# Patient Record
Sex: Male | Born: 1965 | Race: White | Hispanic: No | Marital: Married | Smoking: Never smoker
Health system: Southern US, Community
[De-identification: ages and names within clinical notes are randomized; demographics above are authoritative.]

## PROBLEM LIST (undated history)

## (undated) DIAGNOSIS — S46009A Unspecified injury of muscle(s) and tendon(s) of the rotator cuff of unspecified shoulder, initial encounter: Secondary | ICD-10-CM

## (undated) DIAGNOSIS — J342 Deviated nasal septum: Secondary | ICD-10-CM

## (undated) DIAGNOSIS — N289 Disorder of kidney and ureter, unspecified: Secondary | ICD-10-CM

## (undated) DIAGNOSIS — I10 Essential (primary) hypertension: Secondary | ICD-10-CM

## (undated) HISTORY — PX: NOSE SURGERY: SHX723

## (undated) HISTORY — DX: Deviated nasal septum: J34.2

## (undated) HISTORY — DX: Essential (primary) hypertension: I10

## (undated) HISTORY — DX: Disorder of kidney and ureter, unspecified: N28.9

## (undated) HISTORY — DX: Unspecified injury of muscle(s) and tendon(s) of the rotator cuff of unspecified shoulder, initial encounter: S46.009A

---
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
## 2003-07-02 ENCOUNTER — Emergency Department (HOSPITAL_COMMUNITY): Admission: EM | Admit: 2003-07-02 | Discharge: 2003-07-02 | Payer: Self-pay | Admitting: Emergency Medicine

## 2003-07-06 ENCOUNTER — Inpatient Hospital Stay (HOSPITAL_COMMUNITY): Admission: AD | Admit: 2003-07-06 | Discharge: 2003-07-11 | Payer: Self-pay | Admitting: Family Medicine

## 2003-07-07 ENCOUNTER — Encounter: Payer: Self-pay | Admitting: Infectious Diseases

## 2003-07-17 ENCOUNTER — Encounter: Admission: RE | Admit: 2003-07-17 | Discharge: 2003-07-17 | Payer: Self-pay | Admitting: Family Medicine

## 2005-02-27 ENCOUNTER — Emergency Department (HOSPITAL_COMMUNITY): Admission: EM | Admit: 2005-02-27 | Discharge: 2005-02-28 | Payer: Self-pay | Admitting: Emergency Medicine

## 2014-11-06 ENCOUNTER — Encounter: Payer: Self-pay | Admitting: Internal Medicine

## 2015-01-11 ENCOUNTER — Ambulatory Visit (AMBULATORY_SURGERY_CENTER): Payer: Self-pay

## 2015-01-11 VITALS — Ht 73.5 in | Wt 219.2 lb

## 2015-01-11 DIAGNOSIS — Z1211 Encounter for screening for malignant neoplasm of colon: Secondary | ICD-10-CM

## 2015-01-11 NOTE — Progress Notes (Signed)
Per pt, no allergies to soy or egg products.Pt not taking any weight loss meds or using  O2 at home. 

## 2015-01-18 ENCOUNTER — Encounter: Payer: Self-pay | Admitting: Internal Medicine

## 2015-01-25 ENCOUNTER — Encounter: Payer: Self-pay | Admitting: Internal Medicine

## 2015-01-25 ENCOUNTER — Ambulatory Visit (AMBULATORY_SURGERY_CENTER): Payer: BLUE CROSS/BLUE SHIELD | Admitting: Internal Medicine

## 2015-01-25 VITALS — BP 136/90 | HR 70 | Temp 96.2°F | Resp 13 | Ht 73.5 in | Wt 219.0 lb

## 2015-01-25 DIAGNOSIS — K635 Polyp of colon: Secondary | ICD-10-CM

## 2015-01-25 DIAGNOSIS — Z1211 Encounter for screening for malignant neoplasm of colon: Secondary | ICD-10-CM

## 2015-01-25 DIAGNOSIS — D123 Benign neoplasm of transverse colon: Secondary | ICD-10-CM | POA: Diagnosis not present

## 2015-01-25 MED ORDER — SODIUM CHLORIDE 0.9 % IV SOLN: 500.0000 mL | INTRAVENOUS | Status: DC

## 2015-01-25 NOTE — Progress Notes (Signed)
To recovery, report to [place], Therapist, sports. VSS

## 2015-01-25 NOTE — Progress Notes (Signed)
Called to room to assist during endoscopic procedure.  Patient ID and intended procedure confirmed with present staff. Received instructions for my participation in the procedure from the performing physician.  

## 2015-01-25 NOTE — Patient Instructions (Addendum)
I found and removed one 4 mm polyp that looks benign. Everything else including prostate exam was ok.  I will let you know pathology results and when to have another routine colonoscopy by mail.  I appreciate the opportunity to care for you. Gatha Mayer, MD, FACG  YOU HAD AN ENDOSCOPIC PROCEDURE TODAY AT [facility]:   Refer to the procedure report that was given to you for any specific questions about what was found during the examination.  If the procedure report does not answer your questions, please call your gastroenterologist to clarify.  If you requested that your care partner not be given the details of your procedure findings, then the procedure report has been included in a sealed envelope for you to review at your convenience later.  YOU SHOULD EXPECT: Some feelings of bloating in the abdomen. Passage of more gas than usual.  Walking can help get rid of the air that was put into your GI tract during the procedure and reduce the bloating. If you had a lower endoscopy (such as a colonoscopy or flexible sigmoidoscopy) you may notice spotting of blood in your stool or on the toilet paper. If you underwent a bowel prep for your procedure, you may not have a normal bowel movement for a few days.  Please Note:  You might notice some irritation and congestion in your nose or some drainage.  This is from the oxygen used during your procedure.  There is no need for concern and it should clear up in a day or so.  SYMPTOMS TO REPORT IMMEDIATELY:   Following lower endoscopy (colonoscopy or flexible sigmoidoscopy):  Excessive amounts of blood in the stool  Significant tenderness or worsening of abdominal pains  Swelling of the abdomen that is new, acute  Fever of 100F or higher    For urgent or emergent issues, a gastroenterologist can be reached at any hour by calling [phone].   DIET: Your first meal following the procedure should be a small meal and  then it is ok to progress to your normal diet. Heavy or fried foods are harder to digest and may make you feel nauseous or bloated.  Likewise, meals heavy in dairy and vegetables can increase bloating.  Drink plenty of fluids but you should avoid alcoholic beverages for 24 hours.  ACTIVITY:  You should plan to take it easy for the rest of today and you should NOT DRIVE or use heavy machinery until tomorrow (because of the sedation medicines used during the test).    FOLLOW UP: Our staff will call the number listed on your records the next business day following your procedure to check on you and address any questions or concerns that you may have regarding the information given to you following your procedure. If we do not reach you, we will leave a message.  However, if you are feeling well and you are not experiencing any problems, there is no need to return our call.  We will assume that you have returned to your regular daily activities without incident.  If any biopsies were taken you will be contacted by phone or by letter within the next 1-3 weeks.  Please call us at [phone] if you have not heard about the biopsies in 3 weeks.    SIGNATURES/CONFIDENTIALITY: You and/or your care partner have signed paperwork which will be entered into your electronic medical record.  These signatures attest to the fact that that the information above  on your After Visit Summary has been reviewed and is understood.  Full responsibility of the confidentiality of this discharge information lies with you and/or your care-partner.  Polyp information given.

## 2015-01-25 NOTE — Op Note (Signed)
[address] [place], [address]   COLONOSCOPY PROCEDURE REPORT  PATIENT: Cody Baxter, Cody Baxter  MR#: [id number] BIRTHDATE: 06-09-1966 , 48  yrs. old GENDER: male ENDOSCOPIST: Gatha Mayer, MD, [facility] PROCEDURE DATE:  01/25/2015 PROCEDURE:   Colonoscopy, screening and Colonoscopy with snare polypectomy First Screening Colonoscopy - Avg.  risk and is 50 yrs.  old or older Yes.  Prior Negative Screening - Now for repeat screening. N/A  History of Adenoma - Now for follow-up colonoscopy & has been > or = to 3 yrs.  N/A ASA CLASS:   Class II INDICATIONS:Screening for colonic neoplasia and Colorectal Neoplasm Risk Assessment for this procedure is average risk. MEDICATIONS: Propofol 350 mg IV and Monitored anesthesia care  DESCRIPTION OF PROCEDURE:   After the risks benefits and alternatives of the procedure were thoroughly explained, informed consent was obtained.  The digital rectal exam revealed no abnormalities of the rectum, revealed no prostatic nodules, and revealed the prostate was not enlarged.   The LB [id number] endoscope was introduced through the anus and advanced to the cecum, which was identified by both the appendix and ileocecal valve. No adverse events experienced.   The quality of the prep was excellent.  (MiraLax was used)  The instrument was then slowly withdrawn as the colon was fully examined.      COLON FINDINGS: A sessile polyp measuring 4 mm in size was found in the transverse colon.  A polypectomy was performed with a cold snare.  The resection was complete, the polyp tissue was completely retrieved and sent to histology.   The examination was otherwise normal.  Retroflexed views revealed no abnormalities. The time to cecum = 2.0 Withdrawal time = 11.0   The scope was withdrawn and the procedure completed. COMPLICATIONS: There were no immediate complications.  ENDOSCOPIC IMPRESSION: 1.   Sessile polyp was found in  the transverse colon; polypectomy was performed with a cold snare 2.   The examination was otherwise normal  RECOMMENDATIONS: Timing of repeat colonoscopy will be determined by pathology findings.  eSigned:  Gatha Mayer, MD, [facility] 01/25/2015 8:29 AM   cc: The Patient and Daiva Eves, MD

## 2015-01-25 NOTE — Progress Notes (Signed)
Patient requests that we do not start IV in Right hand or arm.  IV placed in Left hand by Ferrel Logan without difficulty.

## 2015-01-28 ENCOUNTER — Telehealth: Payer: Self-pay

## 2015-01-28 NOTE — Telephone Encounter (Signed)
  Follow up Call-  Call back number 01/25/2015  Post procedure Call Back phone  # [phone]  Permission to leave phone message Yes     Patient questions:  Do you have a fever, pain , or abdominal swelling? No. Pain Score  0 *  Have you tolerated food without any problems? Yes.    Have you been able to return to your normal activities? Yes.    Do you have any questions about your discharge instructions: Diet   No. Medications  No. Follow up visit  No.  Do you have questions or concerns about your Care? No.  Actions: * If pain score is 4 or above: No action needed, pain <4.

## 2015-01-30 ENCOUNTER — Encounter: Payer: Self-pay | Admitting: Internal Medicine

## 2015-01-30 NOTE — Progress Notes (Signed)
Quick Note:  Lymphoid polyp Repeat colonoscopy 2026 ______

## 2016-02-25 DIAGNOSIS — N4231 Prostatic intraepithelial neoplasia: Secondary | ICD-10-CM | POA: Diagnosis not present

## 2016-03-06 DIAGNOSIS — R972 Elevated prostate specific antigen [PSA]: Secondary | ICD-10-CM | POA: Diagnosis not present

## 2016-03-06 DIAGNOSIS — N4231 Prostatic intraepithelial neoplasia: Secondary | ICD-10-CM | POA: Diagnosis not present

## 2016-03-06 DIAGNOSIS — Z0000 Encounter for general adult medical examination without abnormal findings: Secondary | ICD-10-CM | POA: Diagnosis not present

## 2016-06-30 DIAGNOSIS — E782 Mixed hyperlipidemia: Secondary | ICD-10-CM | POA: Diagnosis not present

## 2016-06-30 DIAGNOSIS — R7303 Prediabetes: Secondary | ICD-10-CM | POA: Diagnosis not present

## 2016-07-16 DIAGNOSIS — R7303 Prediabetes: Secondary | ICD-10-CM | POA: Diagnosis not present

## 2016-07-16 DIAGNOSIS — Z1389 Encounter for screening for other disorder: Secondary | ICD-10-CM | POA: Diagnosis not present

## 2016-07-16 DIAGNOSIS — E782 Mixed hyperlipidemia: Secondary | ICD-10-CM | POA: Diagnosis not present

## 2016-07-16 DIAGNOSIS — Z6828 Body mass index (BMI) 28.0-28.9, adult: Secondary | ICD-10-CM | POA: Diagnosis not present

## 2016-07-16 DIAGNOSIS — I10 Essential (primary) hypertension: Secondary | ICD-10-CM | POA: Diagnosis not present

## 2016-09-01 DIAGNOSIS — N4231 Prostatic intraepithelial neoplasia: Secondary | ICD-10-CM | POA: Diagnosis not present

## 2017-02-19 DIAGNOSIS — H5212 Myopia, left eye: Secondary | ICD-10-CM | POA: Diagnosis not present

## 2017-02-22 DIAGNOSIS — N400 Enlarged prostate without lower urinary tract symptoms: Secondary | ICD-10-CM | POA: Diagnosis not present

## 2017-02-22 DIAGNOSIS — R972 Elevated prostate specific antigen [PSA]: Secondary | ICD-10-CM | POA: Diagnosis not present

## 2017-02-22 LAB — PSA: PSA: 2.63

## 2017-04-23 ENCOUNTER — Ambulatory Visit (INDEPENDENT_AMBULATORY_CARE_PROVIDER_SITE_OTHER): Payer: BLUE CROSS/BLUE SHIELD | Admitting: Family Medicine

## 2017-04-23 ENCOUNTER — Encounter: Payer: Self-pay | Admitting: Family Medicine

## 2017-04-23 VITALS — BP 138/85 | HR 79 | Ht 73.0 in | Wt 218.5 lb

## 2017-04-23 DIAGNOSIS — J3089 Other allergic rhinitis: Secondary | ICD-10-CM | POA: Diagnosis not present

## 2017-04-23 DIAGNOSIS — I10 Essential (primary) hypertension: Secondary | ICD-10-CM | POA: Diagnosis not present

## 2017-04-23 DIAGNOSIS — E663 Overweight: Secondary | ICD-10-CM

## 2017-04-23 DIAGNOSIS — E781 Pure hyperglyceridemia: Secondary | ICD-10-CM | POA: Diagnosis not present

## 2017-04-23 NOTE — Progress Notes (Signed)
New patient office visit note:  Impression and Recommendations:    1. HTN, goal below 130/80   2. Hypertriglyceridemia   3. Overweight (BMI 25.0-29.9)   4. Environmental and seasonal allergies    - bp not at goal- encouraged diet and lifestyle mod in addition; home monitoring  - will check labs  - Explained to patient what BMI refers to, and what it means medically.    Told patient to think about it as a "medical risk stratification measurement" and how increasing BMI is associated with increasing risk/ or worsening state of various diseases such as hypertension, hyperlipidemia, diabetes, premature OA, depression etc. -American Heart Association guidelines for healthy diet, basically Mediterranean diet, and exercise guidelines of 30 minutes 5 days per week or more discussed in detail.  - encouraged OTC meds to help with sx; netti pot encouraged  -Health counseling performed.  All questions answered.  - The patient was counseled, risk factors were discussed, anticipatory guidance given.  Please sign up for my chart and we will contact you regarding your labs sooner than later if there are critical findings.  If need be we will bring you in to discuss it in person with you if it requires a treatment change.  Otherwise I will see you in 4-6 months.    Thanks for coming in- nice to meet you!    Orders Placed This Encounter  Procedures  . CBC with Differential/Platelet  . Comprehensive metabolic panel  . Hemoglobin A1c  . Lipid panel  . TSH  . T4, free  . VITAMIN D 25 Hydroxy (Vit-D Deficiency, Fractures)     Gross side effects, risk and benefits, and alternatives of medications discussed with patient.  Patient is aware that all medications have potential side effects and we are unable to predict every side effect or drug-drug interaction that may occur.  Expresses verbal understanding and consents to current therapy plan and treatment regimen.  Return in about 5 months  (around 09/23/2017) for For wellness exam & health maintenance eval- yrly.  Please see AVS handed out to patient at the end of our visit for further patient instructions/ counseling done pertaining to today's office visit.    Note: This document was prepared using Dragon voice recognition software and may include unintentional dictation errors.  ---------------------------------------------------------------------------------------------------------------------    Subjective:    Chief complaint:   Chief Complaint  Patient presents with  . 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   I asked the patient to review their chronic problem list with me to ensure everything was updated and accurate.    All recent office visits with other providers, any medical records that patient brought in etc  - I reviewed today.     Also asked pt to get me medical records from [facility] providers/ specialists that they had seen within the past 3-5 years- if they are in private practice and/or do not work for a [employer], [facility], [facility] or [employer] owned practice.  Told them to call their specialists to clarify this if they are not sure.   History of hypertension, TG, overwt.   Family history: Father with Prostate ca- rad and chemo/ prostatectomy ( age 43), lung ca (SCC),  AMI-s/p stent-age 71  Wt Readings from Last 3 Encounters:  09/03/17 219 lb (99.3 kg)  04/23/17 218 lb 8 oz (99.1 kg)  01/25/15 219 lb (99.3 kg)   BP Readings from Last 3 Encounters:  09/03/17 (!) 148/98  04/23/17 138/85  01/25/15 136/90   Pulse Readings from Last 3 Encounters:  09/03/17 68  04/23/17 79  01/25/15 70   BMI Readings from Last 3 Encounters:  09/03/17 29.70 kg/m  04/23/17 28.83 kg/m  01/25/15 28.50 kg/m    Patient  Care Team    Relationship Specialty Notifications Start End  Mellody Dance, DO PCP - General Family Medicine  04/23/17   Jovita Gamma, [place] Physician Neurosurgery  04/23/17   Rana Snare, MD Consulting Physician Urology  04/23/17   Gatha Mayer, MD Consulting Physician Gastroenterology  04/23/17   Dr. Samara Snide  Optometry  04/26/17   Dr. Michele Mcalpine  Dermatology  04/26/17     Patient Active Problem List   Diagnosis Date Noted  . HTN, goal below 130/80 04/23/2017    Priority: High  . HLD (hyperlipidemia) 09/03/2017    Priority: Medium  . Hypertriglyceridemia 09/03/2017    Priority: Medium  . Low HDL (under 40) 09/03/2017    Priority: Medium  . Environmental and seasonal allergies 10/08/2017    Priority: Low  . Overweight (BMI 25.0-29.9) 09/03/2017  . Nonspecific chest pain 09/03/2017  . Encounter for general adult medical examination with abnormal findings 09/03/2017  . Other chest pain 09/03/2017     Past Medical History:  Diagnosis Date  . Deviated septum   . Hypertension   . Kidney disease      Past Medical History:  Diagnosis Date  . Deviated septum   . Hypertension   . Kidney disease      Past Surgical History:  Procedure Laterality Date  . NOSE SURGERY    . TONSILLECTOMY AND ADENOIDECTOMY  1978   adernoids only     Family History  Problem Relation Age of Onset  . Prostate cancer Father   . Lung cancer Father      Social History   Substance and Sexual Activity  Drug Use No     Social History   Substance and Sexual Activity  Alcohol Use No  . Alcohol/week: 0.0 oz     Social History   Tobacco Use  Smoking Status Never Smoker  Smokeless Tobacco Never Used     Outpatient Encounter Medications as of 04/23/2017  Medication Sig  . fenofibrate micronized (LOFIBRA) 134 MG capsule Take 134 mg by mouth daily before breakfast.  . triamterene-hydrochlorothiazide (MAXZIDE-25) 37.5-25 MG per tablet Take 1 tablet by mouth. Take one pill  2-3 times a week   No facility-administered encounter medications on file as of 04/23/2017.     Allergies: Patient has no known allergies.   Review of Systems  Constitutional: Negative for chills, diaphoresis, fever, malaise/fatigue and weight loss.  HENT: Negative for congestion, sore throat and tinnitus.   Eyes: Negative for blurred vision, double vision and photophobia.  Respiratory: Negative for cough and wheezing.   Cardiovascular: Negative for chest pain and palpitations.  Gastrointestinal: Negative for blood in stool, diarrhea, nausea and vomiting.  Genitourinary: Negative for dysuria, frequency and urgency.  Musculoskeletal: Negative for joint pain and myalgias.  Skin: Negative for itching and rash.  Neurological: Negative for dizziness, focal weakness, weakness and headaches.  Endo/Heme/Allergies: Negative for environmental allergies and polydipsia. Does not bruise/bleed easily.  Psychiatric/Behavioral: Negative for depression and memory loss. The patient is not  nervous/anxious and does not have insomnia.      Objective:   Blood pressure 138/85, pulse 79, height 6\' 1"  (1.854 m), weight 218 lb 8 oz (99.1 kg). Body mass index is 28.83 kg/m. General: Well Developed, well nourished, and in no acute distress.  Neuro: Alert and oriented x3, extra-ocular muscles intact, sensation grossly intact.  HEENT:[place]/AT, PERRLA, neck supple, No carotid bruits Skin: no gross rashes  Cardiac: Regular rate and rhythm Respiratory: Essentially clear to auscultation bilaterally. Not using accessory muscles, speaking in full sentences.  Abdominal: not grossly distended Musculoskeletal: Ambulates w/o diff, FROM * 4 ext.  Vasc: less 2 sec cap RF, warm and pink  Psych:  No HI/SI, judgement and insight good, Euthymic mood. Full Affect.

## 2017-04-23 NOTE — Patient Instructions (Addendum)
Please sign up for my chart and we will contact you regarding your labs sooner than later if there are critical findings.  If need be we will bring you in to discuss it in person with you if it requires a treatment change.  Otherwise I will see you in 4-6 months.    Thanks for coming in- nice to meet you!     Please realize, EXERCISE IS MEDICINE!  -  American Heart Association [facility]) guidelines for exercise : If you are in good health, without any medical conditions, you should engage in 150 minutes of moderate intensity aerobic activity per week.  This means you should be huffing and puffing throughout your workout.   Engaging in regular exercise will improve brain function and memory, as well as improve mood, boost immune system and help with weight management.  As well as the other, more well-known effects of exercise such as decreasing blood sugar levels, decreasing blood pressure,  and decreasing bad cholesterol levels/ increasing good cholesterol levels.     -  The AHA strongly endorses consumption of a diet that contains a variety of foods from all the food categories with an emphasis on fruits and vegetables; fat-free and low-fat dairy products; cereal and grain products; legumes and nuts; and fish, poultry, and/or extra lean meats.    Excessive food intake, especially of foods high in saturated and trans fats, sugar, and salt, should be avoided.    Adequate water intake of roughly 1/2 of your weight in pounds, should equal the ounces of water per day you should drink.  So for instance, if you're 200 pounds, that would be 100 ounces of water per day.         Mediterranean Diet  Why follow it? Research shows. . Those who follow the Mediterranean diet have a reduced risk of heart disease  . The diet is associated with a reduced incidence of Parkinson's and Alzheimer's diseases . People following the diet may have longer life expectancies and lower rates of chronic diseases  . The Dietary  Guidelines for Americans recommends the Mediterranean diet as an eating plan to promote health and prevent disease  What Is the Mediterranean Diet?  . Healthy eating plan based on typical foods and recipes of Mediterranean-style cooking . The diet is primarily a plant based diet; these foods should make up a majority of meals   Starches - Plant based foods should make up a majority of meals - They are an important sources of vitamins, minerals, energy, antioxidants, and fiber - Choose whole grains, foods high in fiber and minimally processed items  - Typical grain sources include wheat, oats, barley, corn, brown rice, bulgar, farro, millet, polenta, couscous  - Various types of beans include chickpeas, lentils, fava beans, black beans, white beans   Fruits  Veggies - Large quantities of antioxidant rich fruits & veggies; 6 or more servings  - Vegetables can be eaten raw or lightly drizzled with oil and cooked  - Vegetables common to the traditional Mediterranean Diet include: artichokes, arugula, beets, broccoli, brussel sprouts, cabbage, carrots, celery, collard greens, cucumbers, eggplant, kale, leeks, lemons, lettuce, mushrooms, okra, onions, peas, peppers, potatoes, pumpkin, radishes, rutabaga, shallots, spinach, sweet potatoes, turnips, zucchini - Fruits common to the Mediterranean Diet include: apples, apricots, avocados, cherries, clementines, dates, figs, grapefruits, grapes, melons, nectarines, oranges, peaches, pears, pomegranates, strawberries, tangerines  Fats - Replace butter and margarine with healthy oils, such as olive oil, canola oil, and tahini  -  Limit nuts to no more than a handful a day  - Nuts include walnuts, almonds, pecans, pistachios, pine nuts  - Limit or avoid candied, honey roasted or heavily salted nuts - Olives are central to the Mediterranean diet - can be eaten whole or used in a variety of dishes   Meats Protein - Limiting red meat: no more than a few times a  month - When eating red meat: choose lean cuts and keep the portion to the size of deck of cards - Eggs: approx. 0 to 4 times a week  - Fish and lean poultry: at least 2 a week  - Healthy protein sources include, chicken, [place], lean beef, lamb - Increase intake of seafood such as tuna, salmon, trout, mackerel, shrimp, scallops - Avoid or limit high fat processed meats such as sausage and bacon  Dairy - Include moderate amounts of low fat dairy products  - Focus on healthy dairy such as fat free yogurt, skim milk, low or reduced fat cheese - Limit dairy products higher in fat such as whole or 2% milk, cheese, ice cream  Alcohol - Moderate amounts of red wine is ok  - No more than 5 oz daily for women (all ages) and men older than age 38  - No more than 10 oz of wine daily for men younger than 52  Other - Limit sweets and other desserts  - Use herbs and spices instead of salt to flavor foods  - Herbs and spices common to the traditional Mediterranean Diet include: basil, bay leaves, chives, cloves, cumin, fennel, garlic, lavender, marjoram, mint, oregano, parsley, pepper, rosemary, sage, savory, sumac, tarragon, thyme   It's not just a diet, it's a lifestyle:  . The Mediterranean diet includes lifestyle factors typical of those in the region  . Foods, drinks and meals are best eaten with others and savored . Daily physical activity is important for overall good health . This could be strenuous exercise like running and aerobics . This could also be more leisurely activities such as walking, housework, yard-work, or taking the stairs . Moderation is the key; a balanced and healthy diet accommodates most foods and drinks . Consider portion sizes and frequency of consumption of certain foods   Meal Ideas & Options:  . Breakfast:  o Whole wheat toast or whole wheat English muffins with peanut butter & hard boiled egg o Steel cut oats topped with apples & cinnamon and skim milk  o Fresh  fruit: banana, strawberries, melon, berries, peaches  o Smoothies: strawberries, bananas, greek yogurt, peanut butter o Low fat greek yogurt with blueberries and granola  o Egg white omelet with spinach and mushrooms o Breakfast couscous: whole wheat couscous, apricots, skim milk, cranberries  . Sandwiches:  o Hummus and grilled vegetables (peppers, zucchini, squash) on whole wheat bread   o Grilled chicken on whole wheat pita with lettuce, tomatoes, cucumbers or tzatziki  o Tuna salad on whole wheat bread: tuna salad made with greek yogurt, olives, red peppers, capers, green onions o Garlic rosemary lamb pita: lamb sauted with garlic, rosemary, salt & pepper; add lettuce, cucumber, greek yogurt to pita - flavor with lemon juice and black pepper  . Seafood:  o Mediterranean grilled salmon, seasoned with garlic, basil, parsley, lemon juice and black pepper o Shrimp, lemon, and spinach whole-grain pasta salad made with low fat greek yogurt  o Seared scallops with lemon orzo  o Seared tuna steaks seasoned salt, pepper, coriander topped with tomato  mixture of olives, tomatoes, olive oil, minced garlic, parsley, green onions and cappers  . Meats:  o Herbed greek chicken salad with kalamata olives, cucumber, feta  o Red bell peppers stuffed with spinach, bulgur, lean ground beef (or lentils) & topped with feta   o Kebabs: skewers of chicken, tomatoes, onions, zucchini, squash  o [place] burgers: made with red onions, mint, dill, lemon juice, feta cheese topped with roasted red peppers . Vegetarian o Cucumber salad: cucumbers, artichoke hearts, celery, red onion, feta cheese, tossed in olive oil & lemon juice  o Hummus and whole grain pita points with a greek salad (lettuce, tomato, feta, olives, cucumbers, red onion) o Lentil soup with celery, carrots made with vegetable broth, garlic, salt and pepper  o Tabouli salad: parsley, bulgur, mint, scallions, cucumbers, tomato, radishes, lemon juice,  olive oil, salt and pepper.

## 2017-04-24 LAB — CBC WITH DIFFERENTIAL/PLATELET
BASOS ABS: 0.1 10*3/uL (ref 0.0–0.2)
Basos: 1 %
EOS (ABSOLUTE): 0.2 10*3/uL (ref 0.0–0.4)
Eos: 3 %
Hematocrit: 45.5 % (ref 37.5–51.0)
Hemoglobin: 15.3 g/dL (ref 13.0–17.7)
Immature Grans (Abs): 0 10*3/uL (ref 0.0–0.1)
Immature Granulocytes: 0 %
Lymphocytes Absolute: 1.8 10*3/uL (ref 0.7–3.1)
Lymphs: 31 %
MCH: 27 pg (ref 26.6–33.0)
MCHC: 33.6 g/dL (ref 31.5–35.7)
MCV: 80 fL (ref 79–97)
MONOCYTES: 5 %
MONOS ABS: 0.3 10*3/uL (ref 0.1–0.9)
Neutrophils Absolute: 3.4 10*3/uL (ref 1.4–7.0)
Neutrophils: 60 %
PLATELETS: 253 10*3/uL (ref 150–379)
RBC: 5.66 x10E6/uL (ref 4.14–5.80)
RDW: 14 % (ref 12.3–15.4)
WBC: 5.6 10*3/uL (ref 3.4–10.8)

## 2017-04-24 LAB — COMPREHENSIVE METABOLIC PANEL
ALK PHOS: 92 IU/L (ref 39–117)
ALT: 21 IU/L (ref 0–44)
AST: 19 IU/L (ref 0–40)
Albumin/Globulin Ratio: 2.2 (ref 1.2–2.2)
Albumin: 5 g/dL (ref 3.5–5.5)
BUN/Creatinine Ratio: 17 (ref 9–20)
BUN: 18 mg/dL (ref 6–24)
Bilirubin Total: 0.4 mg/dL (ref 0.0–1.2)
CO2: 25 mmol/L (ref 20–29)
Calcium: 9.4 mg/dL (ref 8.7–10.2)
Chloride: 104 mmol/L (ref 96–106)
Creatinine, Ser: 1.07 mg/dL (ref 0.76–1.27)
GFR calc Af Amer: 92 mL/min/{1.73_m2} (ref >59)
GFR calc non Af Amer: 79 mL/min/{1.73_m2} (ref >59)
GLUCOSE: 93 mg/dL (ref 65–99)
Globulin, Total: 2.3 g/dL (ref 1.5–4.5)
Potassium: 4.5 mmol/L (ref 3.5–5.2)
Sodium: 145 mmol/L — ABNORMAL HIGH (ref 134–144)
Total Protein: 7.3 g/dL (ref 6.0–8.5)

## 2017-04-24 LAB — HEMOGLOBIN A1C
ESTIMATED AVERAGE GLUCOSE: 97 mg/dL
HEMOGLOBIN A1C: 5 % (ref 4.8–5.6)

## 2017-04-24 LAB — LIPID PANEL
Chol/HDL Ratio: 5.1 ratio — ABNORMAL HIGH (ref 0.0–5.0)
Cholesterol, Total: 158 mg/dL (ref 100–199)
HDL: 31 mg/dL — ABNORMAL LOW (ref >39)
LDL Calculated: 65 mg/dL (ref 0–99)
TRIGLYCERIDES: 310 mg/dL — AB (ref 0–149)
VLDL Cholesterol Cal: 62 mg/dL — ABNORMAL HIGH (ref 5–40)

## 2017-04-24 LAB — VITAMIN D 25 HYDROXY (VIT D DEFICIENCY, FRACTURES): VIT D 25 HYDROXY: 41.9 ng/mL (ref 30.0–100.0)

## 2017-04-24 LAB — TSH: TSH: 5.68 u[IU]/mL — AB (ref 0.450–4.500)

## 2017-04-24 LAB — T4, FREE: Free T4: 1.29 ng/dL (ref 0.82–1.77)

## 2017-04-25 ENCOUNTER — Encounter: Payer: Self-pay | Admitting: Family Medicine

## 2017-04-28 ENCOUNTER — Encounter: Payer: Self-pay | Admitting: Family Medicine

## 2017-04-29 NOTE — Telephone Encounter (Signed)
Called patient and informed him that Dr Raliegh Scarlet is reviewing his labs and they would be available for him to view within the hour.  MPulliam, CMA

## 2017-07-19 ENCOUNTER — Encounter: Payer: Self-pay | Admitting: Family Medicine

## 2017-07-23 ENCOUNTER — Encounter: Payer: BLUE CROSS/BLUE SHIELD | Admitting: Family Medicine

## 2017-08-30 ENCOUNTER — Encounter: Payer: Self-pay | Admitting: Family Medicine

## 2017-09-03 ENCOUNTER — Ambulatory Visit (INDEPENDENT_AMBULATORY_CARE_PROVIDER_SITE_OTHER): Payer: BLUE CROSS/BLUE SHIELD | Admitting: Family Medicine

## 2017-09-03 ENCOUNTER — Encounter: Payer: Self-pay | Admitting: Family Medicine

## 2017-09-03 VITALS — BP 148/98 | HR 68 | Ht 72.0 in | Wt 219.0 lb

## 2017-09-03 DIAGNOSIS — E782 Mixed hyperlipidemia: Secondary | ICD-10-CM | POA: Diagnosis not present

## 2017-09-03 DIAGNOSIS — I10 Essential (primary) hypertension: Secondary | ICD-10-CM | POA: Diagnosis not present

## 2017-09-03 DIAGNOSIS — E781 Pure hyperglyceridemia: Secondary | ICD-10-CM

## 2017-09-03 DIAGNOSIS — Z0001 Encounter for general adult medical examination with abnormal findings: Secondary | ICD-10-CM

## 2017-09-03 DIAGNOSIS — Z23 Encounter for immunization: Secondary | ICD-10-CM

## 2017-09-03 DIAGNOSIS — E785 Hyperlipidemia, unspecified: Secondary | ICD-10-CM | POA: Insufficient documentation

## 2017-09-03 DIAGNOSIS — R079 Chest pain, unspecified: Secondary | ICD-10-CM | POA: Insufficient documentation

## 2017-09-03 DIAGNOSIS — E786 Lipoprotein deficiency: Secondary | ICD-10-CM | POA: Insufficient documentation

## 2017-09-03 DIAGNOSIS — R0789 Other chest pain: Secondary | ICD-10-CM | POA: Insufficient documentation

## 2017-09-03 DIAGNOSIS — E663 Overweight: Secondary | ICD-10-CM

## 2017-09-03 NOTE — Progress Notes (Signed)
Male physical  Impression and Recommendations:    1. Encounter for general adult medical examination with abnormal findings   2. HTN, goal below 130/80   3. Mixed hyperlipidemia   4. Hypertriglyceridemia   5. Low HDL (under 40)   6. Overweight (BMI 25.0-29.9)   7. Nonspecific chest pain   8. Other chest pain     Orders Placed This Encounter  Procedures   MYOCARDIAL PERFUSION IMAGING    Standing Status:   Future    Standing Expiration Date:   11/03/2017    Order Specific Question:   Where should this test be performed    Answer:   [facility] [facility]    Order Specific Question:   Type of stress    Answer:   Exercise    Order Specific Question:   Patient weight in lbs    Answer:   219    Order Specific Question:   Reason for Exam additional comments    Answer:   Patient with low risk chest pain, evaluate for possible ischemia     No problem-specific Assessment & Plan notes found for this encounter.    Patient's Medications  New Prescriptions   No medications on file  Previous Medications   FENOFIBRATE MICRONIZED (LOFIBRA) 134 MG CAPSULE    Take 134 mg by mouth daily before breakfast.   TRIAMTERENE-HYDROCHLOROTHIAZIDE (MAXZIDE-25) 37.5-25 MG PER TABLET    Take 1 tablet by mouth. Take one pill 2-3 times a week  Modified Medications   No medications on file  Discontinued Medications   No medications on file     Please see AVS handed out to patient at the end of our visit for further patient instructions/ counseling done pertaining to today's office visit.  1) Anticipatory Guidance: Discussed importance of wearing a seatbelt while driving, not texting while driving;   sunscreen when outside along with skin surveillance; eating a balanced and modest diet; physical activity at least 25 minutes per day or 150 min/ week moderate to intense activity.  2) Immunizations / Screenings / Labs:   All immunizations are up-to-date per recommendations or will be updated  today. Patient is due for dental and vision screens which pt will schedule independently. Will obtain CBC, CMP, HgA1c, Lipid panel, TSH and vit D when fasting, if not already done recently.   3) Weight:  BMI meaning discussed with patient.  Discussed goal of losing 5-10% of current body weight which would improve overall feelings of well being and improve objective health data. Improve nutrient density of diet through increasing intake of fruits and vegetables and decreasing saturated fats, white flour products and refined sugars.    Gross side effects, risk and benefits, and alternatives of medications discussed with patient.  Patient is aware that all medications have potential side effects and we are unable to predict every side effect or drug-drug interaction that may occur.  Expresses verbal understanding and consents to current therapy plan and treatment regimen.  Follow-up preventative CPE in 1 year. Follow-up office visit pending lab work.  F/up sooner for chronic care management and/or prn    Subjective:    CC: CPE  HPI: Cody Baxter is a 51 y.o. male who presents to [facility] today for a yearly health maintenance exam.     Health Maintenance Summary Reviewed and updated, unless pt declines services.  Aspirin: administering 81 mg daily Colonoscopy:     (Unnecessary secondary to < 50  or > 69 years old.) Tdap: Up to date: needs TD  Pneumovax/PPSV23:  Up to date: see Immunizations. Prevnar 13/PCV13:    To be administered at this encounter. Zostavax:    Postponed. Tobacco History Reviewed:    CT scan for screening lung CA:    Abdominal Ultrasound:     ( Unnecessary secondary to < 55 or > 58 years old) Alcohol:    No concerns, no excessive use Exercise Habits:    STD concerns:   none Drug Use:   None Birth control method:   n/a Testicular/penile concerns:       Patient discussed with me concerns about ejaculation and the fact that he cannot  go a second or third time after initial erection and orgasm.  He has questions whether or not this could be low T and if Cialis or medicines like that could help  -Patient admits to noncompliance with his blood pressure and cholesterol medications.  He also has concerns today about a 1-58-month history of some sharp pains in the left side of his chest.  They come on at rest and with activity at times.  It is sharp and stabbing and does not radiate.  His last one minute or so.  He has no other associated symptoms.  Patient is worried about his heart wondering if he needs a stress test.   Health Maintenance  Topic Date Due   INFLUENZA VACCINE  08/09/2018 (Originally 06/09/2017)   TETANUS/TDAP  04/23/2029 (Originally 05/31/1983)   Hepatitis C Screening  04/23/2029 (Originally 1966/06/16)   HIV Screening  04/23/2029 (Originally 05/31/1979)   COLONOSCOPY  01/24/2025      Wt Readings from Last 3 Encounters:  09/03/17 219 lb (99.3 kg)  04/23/17 218 lb 8 oz (99.1 kg)  01/25/15 219 lb (99.3 kg)   BP Readings from Last 3 Encounters:  09/03/17 (!) 148/98  04/23/17 138/85  01/25/15 136/90   Pulse Readings from Last 3 Encounters:  09/03/17 68  04/23/17 79  01/25/15 70    Patient Active Problem List   Diagnosis Date Noted   HLD (hyperlipidemia) 09/03/2017   Hypertriglyceridemia 09/03/2017   Low HDL (under 40) 09/03/2017   Overweight (BMI 25.0-29.9) 09/03/2017   Nonspecific chest pain 09/03/2017   Encounter for general adult medical examination with abnormal findings 09/03/2017   Other chest pain 09/03/2017   HTN, goal below 130/80 04/23/2017    Past Medical History:  Diagnosis Date   Deviated septum    Hypertension    Kidney disease     Past Surgical History:  Procedure Laterality Date   NOSE SURGERY     TONSILLECTOMY AND ADENOIDECTOMY  1978   adernoids only    Family History  Problem Relation Age of Onset   Prostate cancer Father    Lung cancer Father     History  Drug  Use No  ,  History  Alcohol Use No  ,  History  Smoking Status   Never Smoker  Smokeless Tobacco   Never Used  ,  History  Sexual Activity   Sexual activity: Yes    Patient's Medications  New Prescriptions   No medications on file  Previous Medications   FENOFIBRATE MICRONIZED (LOFIBRA) 134 MG CAPSULE    Take 134 mg by mouth daily before breakfast.   TRIAMTERENE-HYDROCHLOROTHIAZIDE (MAXZIDE-25) 37.5-25 MG PER TABLET    Take 1 tablet by mouth. Take one pill 2-3 times a week  Modified Medications   No medications on file  Discontinued Medications  No medications on file    Patient has no known allergies.  Review of Systems: General:   Denies fever, chills, unexplained weight loss.  Optho/Auditory:   Denies visual changes, blurred vision/LOV Respiratory:   Denies SOB, DOE more than baseline levels.   Cardiovascular:   Denies chest pain, palpitations, new onset peripheral edema  Gastrointestinal:   Denies nausea, vomiting, diarrhea.  Genitourinary: Denies dysuria, freq/ urgency, flank pain or discharge from genitals.  Endocrine:     Denies hot or cold intolerance, polyuria, polydipsia. Musculoskeletal:   Denies unexplained myalgias, joint swelling, unexplained arthralgias, gait problems.  Skin:  Denies rash, suspicious lesions Neurological:     Denies dizziness, unexplained weakness, numbness  Psychiatric/Behavioral:   Denies mood changes, suicidal or homicidal ideations, hallucinations    Objective:     Blood pressure (!) 148/98, pulse 68, height 6' (1.829 m), weight 219 lb (99.3 kg). Body mass index is 29.7 kg/m. General Appearance:    Alert, cooperative, no distress, appears stated age  Head:    Normocephalic, without obvious abnormality, atraumatic  Eyes:    PERRL, conjunctiva/corneas clear, EOM's intact, fundi    benign, both eyes  Ears:    Normal TM's and external ear canals, both ears  Nose:   Nares normal, septum midline, mucosa normal, no drainage    or  sinus tenderness  Throat:   Lips w/o lesion, mucosa moist, and tongue normal; teeth and   gums normal  Neck:   Supple, symmetrical, trachea midline, no adenopathy;    thyroid:  no enlargement/tenderness/nodules; no carotid   bruit or JVD  Back:     Symmetric, no curvature, ROM normal, no CVA tenderness  Lungs:     Clear to auscultation bilaterally, respirations unlabored, no       Wh/ R/ R  Chest Wall:    No tenderness or gross deformity; normal excursion   Heart:    Regular rate and rhythm, S1 and S2 normal, no murmur, rub   or gallop  Abdomen:     Soft, non-tender, bowel sounds active all four quadrants, NO   G/R/R, no masses, no organomegaly  Genitalia:    Ext genitalia: without lesion, no penile rash or discharge, no hernias appreciated   Rectal:    Normal tone, prostate WNL's and equal b/l, no tenderness; guaiac negative stool  Extremities:   Extremities normal, atraumatic, no cyanosis or gross edema  Pulses:   2+ and symmetric all extremities  Skin:   Warm, dry, Skin color, texture, turgor normal, no obvious rashes or lesions  M-Sk:   Ambulates * 4 w/o difficulty, no gross deformities, tone WNL  Neurologic:   CNII-XII intact, normal strength, sensation and reflexes    Throughout Psych:  No HI/SI, judgement and insight good, Euthymic mood. Full Affect.

## 2017-09-03 NOTE — Patient Instructions (Addendum)
Please follow-up in 1-2 months where we will recheck your blood pressure-please bring in a log of home blood pressures.   -Next office visit we will readdress concerns about erectile function and consider changing from a diuretic blood pressure medicine to something such as losartan. -Follow-up office visit we have also discussed what your exercise stress test shows. -Please remember to take a men's once a day multivitamin with at least 11-1998 IUs vitamin D  Preventive Care for Adults, Male A healthy lifestyle and preventive care can promote health and wellness. Preventive health guidelines for men include the following key practices:  A routine yearly physical is a good way to check with your health care provider about your health and preventative screening. It is a chance to share any concerns and updates on your health and to receive a thorough exam.  Visit your dentist for a routine exam and preventative care every 6 months. Brush your teeth twice a day and floss once a day. Good oral hygiene prevents tooth decay and gum disease.  The frequency of eye exams is based on your age, health, family medical history, use of contact lenses, and other factors. Follow your health care provider's recommendations for frequency of eye exams.  Eat a healthy diet. Foods such as vegetables, fruits, whole grains, low-fat dairy products, and lean protein foods contain the nutrients you need without too many calories. Decrease your intake of foods high in solid fats, added sugars, and salt. Eat the right amount of calories for you.Get information about a proper diet from your health care provider, if necessary.  Regular physical exercise is one of the most important things you can do for your health. Most adults should get at least 150 minutes of moderate-intensity exercise (any activity that increases your heart rate and causes you to sweat) each week. In addition, most adults need muscle-strengthening exercises  on 2 or more days a week.  Maintain a healthy weight. The body mass index (BMI) is a screening tool to identify possible weight problems. It provides an estimate of body fat based on height and weight. Your health care provider can find your BMI and can help you achieve or maintain a healthy weight.For adults 20 years and older:  A BMI below 18.5 is considered underweight.  A BMI of 18.5 to 24.9 is normal.  A BMI of 25 to 29.9 is considered overweight.  A BMI of 30 and above is considered obese.  Maintain normal blood lipids and cholesterol levels by exercising and minimizing your intake of saturated fat. Eat a balanced diet with plenty of fruit and vegetables. Blood tests for lipids and cholesterol should begin at age 40 and be repeated every 5 years. If your lipid or cholesterol levels are high, you are over 50, or you are at high risk for heart disease, you may need your cholesterol levels checked more frequently.Ongoing high lipid and cholesterol levels should be treated with medicines if diet and exercise are not working.  If you smoke, find out from your health care provider how to quit. If you do not use tobacco, do not start.  Lung cancer screening is recommended for adults aged 21-80 years who are at high risk for developing lung cancer because of a history of smoking. A yearly low-dose CT scan of the lungs is recommended for people who have at least a 30-pack-year history of smoking and are a current smoker or have quit within the past 15 years. A pack year of smoking is  smoking an average of 1 pack of cigarettes a day for 1 year (for example: 1 pack a day for 30 years or 2 packs a day for 15 years). Yearly screening should continue until the smoker has stopped smoking for at least 15 years. Yearly screening should be stopped for people who develop a health problem that would prevent them from having lung cancer treatment.  If you choose to drink alcohol, do not have more than 2 drinks  per day. One drink is considered to be 12 ounces (355 mL) of beer, 5 ounces (148 mL) of wine, or 1.5 ounces (44 mL) of liquor.  Avoid use of street drugs. Do not share needles with anyone. Ask for help if you need support or instructions about stopping the use of drugs.  High blood pressure causes heart disease and increases the risk of stroke. Your blood pressure should be checked at least every 1-2 years. Ongoing high blood pressure should be treated with medicines, if weight loss and exercise are not effective.  If you are 65-20 years old, ask your health care provider if you should take aspirin to prevent heart disease.  Diabetes screening is done by taking a blood sample to check your blood glucose level after you have not eaten for a certain period of time (fasting). If you are not overweight and you do not have risk factors for diabetes, you should be screened once every 3 years starting at age 37. If you are overweight or obese and you are 90-25 years of age, you should be screened for diabetes every year as part of your cardiovascular risk assessment.  Colorectal cancer can be detected and often prevented. Most routine colorectal cancer screening begins at the age of 38 and continues through age 85. However, your health care provider may recommend screening at an earlier age if you have risk factors for colon cancer. On a yearly basis, your health care provider may provide home test kits to check for hidden blood in the stool. Use of a small camera at the end of a tube to directly examine the colon (sigmoidoscopy or colonoscopy) can detect the earliest forms of colorectal cancer. Talk to your health care provider about this at age 42, when routine screening begins. Direct exam of the colon should be repeated every 5-10 years through age 44, unless early forms of precancerous polyps or small growths are found.  People who are at an increased risk for hepatitis B should be screened for this virus.  You are considered at high risk for hepatitis B if:  You were born in a country where hepatitis B occurs often. Talk with your health care provider about which countries are considered high risk.  Your parents were born in a high-risk country and you have not received a shot to protect against hepatitis B (hepatitis B vaccine).  You have HIV or AIDS.  You use needles to inject street drugs.  You live with, or have sex with, someone who has hepatitis B.  You are a man who has sex with other men (MSM).  You get hemodialysis treatment.  You take certain medicines for conditions such as cancer, organ transplantation, and autoimmune conditions.  Hepatitis C blood testing is recommended for all people born from [birth year] through 1965 and any individual with known risks for hepatitis C.  Practice safe sex. Use condoms and avoid high-risk sexual practices to reduce the spread of sexually transmitted infections (STIs). STIs include gonorrhea, chlamydia, syphilis, trichomonas, herpes, HPV,  and human immunodeficiency virus (HIV). Herpes, HIV, and HPV are viral illnesses that have no cure. They can result in disability, cancer, and death.  If you are a man who has sex with other men, you should be screened at least once per year for:  HIV.  Urethral, rectal, and pharyngeal infection of gonorrhea, chlamydia, or both.  If you are at risk of being infected with HIV, it is recommended that you take a prescription medicine daily to prevent HIV infection. This is called preexposure prophylaxis (PrEP). You are considered at risk if:  You are a man who has sex with other men (MSM) and have other risk factors.  You are a heterosexual man, are sexually active, and are at increased risk for HIV infection.  You take drugs by injection.  You are sexually active with a partner who has HIV.  Talk with your health care provider about whether you are at high risk of being infected with HIV. If you choose to  begin PrEP, you should first be tested for HIV. You should then be tested every 3 months for as long as you are taking PrEP.  A one-time screening for abdominal aortic aneurysm (AAA) and surgical repair of large AAAs by ultrasound are recommended for men ages 11 to 37 years who are current or former smokers.  Healthy men should no longer receive prostate-specific antigen (PSA) blood tests as part of routine cancer screening. Talk with your health care provider about prostate cancer screening.  Testicular cancer screening is not recommended for adult males who have no symptoms. Screening includes self-exam, a health care provider exam, and other screening tests. Consult with your health care provider about any symptoms you have or any concerns you have about testicular cancer.  Use sunscreen. Apply sunscreen liberally and repeatedly throughout the day. You should seek shade when your shadow is shorter than you. Protect yourself by wearing long sleeves, pants, a wide-brimmed hat, and sunglasses year round, whenever you are outdoors.  Once a month, do a whole-body skin exam, using a mirror to look at the skin on your back. Tell your health care provider about new moles, moles that have irregular borders, moles that are larger than a pencil eraser, or moles that have changed in shape or color.  Stay current with required vaccines (immunizations).  Influenza vaccine. All adults should be immunized every year.  Tetanus, diphtheria, and acellular pertussis (Td, Tdap) vaccine. An adult who has not previously received Tdap or who does not know his vaccine status should receive 1 dose of Tdap. This initial dose should be followed by tetanus and diphtheria toxoids (Td) booster doses every 10 years. Adults with an unknown or incomplete history of completing a 3-dose immunization series with Td-containing vaccines should begin or complete a primary immunization series including a Tdap dose. Adults should receive  a Td booster every 10 years.  Varicella vaccine. An adult without evidence of immunity to varicella should receive 2 doses or a second dose if he has previously received 1 dose.  Human papillomavirus (HPV) vaccine. Males aged 11-21 years who have not received the vaccine previously should receive the 3-dose series. Males aged 22-26 years may be immunized. Immunization is recommended through the age of 64 years for any male who has sex with males and did not get any or all doses earlier. Immunization is recommended for any person with an immunocompromised condition through the age of 38 years if he did not get any or all doses earlier.  During the 3-dose series, the second dose should be obtained 4-8 weeks after the first dose. The third dose should be obtained 24 weeks after the first dose and 16 weeks after the second dose.  Zoster vaccine. One dose is recommended for adults aged 85 years or older unless certain conditions are present.  Measles, mumps, and rubella (MMR) vaccine. Adults born before 65 generally are considered immune to measles and mumps. Adults born in [birth year] or later should have 1 or more doses of MMR vaccine unless there is a contraindication to the vaccine or there is laboratory evidence of immunity to each of the three diseases. A routine second dose of MMR vaccine should be obtained at least 28 days after the first dose for students attending postsecondary schools, health care workers, or international travelers. People who received inactivated measles vaccine or an unknown type of measles vaccine during 1963-1967 should receive 2 doses of MMR vaccine. People who received inactivated mumps vaccine or an unknown type of mumps vaccine before 1979 and are at high risk for mumps infection should consider immunization with 2 doses of MMR vaccine. Unvaccinated health care workers born before [birth year] who lack laboratory evidence of measles, mumps, or rubella immunity or laboratory confirmation of  disease should consider measles and mumps immunization with 2 doses of MMR vaccine or rubella immunization with 1 dose of MMR vaccine.  Pneumococcal 13-valent conjugate (PCV13) vaccine. When indicated, a person who is uncertain of his immunization history and has no record of immunization should receive the PCV13 vaccine. All adults 51 years of age and older should receive this vaccine. An adult aged 47 years or older who has certain medical conditions and has not been previously immunized should receive 1 dose of PCV13 vaccine. This PCV13 should be followed with a dose of pneumococcal polysaccharide (PPSV23) vaccine. Adults who are at high risk for pneumococcal disease should obtain the PPSV23 vaccine at least 8 weeks after the dose of PCV13 vaccine. Adults older than 53 years of age who have normal immune system function should obtain the PPSV23 vaccine dose at least 1 year after the dose of PCV13 vaccine.  Pneumococcal polysaccharide (PPSV23) vaccine. When PCV13 is also indicated, PCV13 should be obtained first. All adults aged 87 years and older should be immunized. An adult younger than age 80 years who has certain medical conditions should be immunized. Any person who resides in a nursing home or long-term care facility should be immunized. An adult smoker should be immunized. People with an immunocompromised condition and certain other conditions should receive both PCV13 and PPSV23 vaccines. People with human immunodeficiency virus (HIV) infection should be immunized as soon as possible after diagnosis. Immunization during chemotherapy or radiation therapy should be avoided. Routine use of PPSV23 vaccine is not recommended for American Indians, [address], or people younger than 65 years unless there are medical conditions that require PPSV23 vaccine. When indicated, people who have unknown immunization and have no record of immunization should receive PPSV23 vaccine. One-time revaccination 5 years  after the first dose of PPSV23 is recommended for people aged 19-64 years who have chronic kidney failure, nephrotic syndrome, asplenia, or immunocompromised conditions. People who received 1-2 doses of PPSV23 before age 67 years should receive another dose of PPSV23 vaccine at age 73 years or later if at least 5 years have passed since the previous dose. Doses of PPSV23 are not needed for people immunized with PPSV23 at or after age 70 years.  Meningococcal vaccine. Adults with  asplenia or persistent complement component deficiencies should receive 2 doses of quadrivalent meningococcal conjugate (MenACWY-D) vaccine. The doses should be obtained at least 2 months apart. Microbiologists working with certain meningococcal bacteria, [place] recruits, people at risk during an outbreak, and people who travel to or live in countries with a high rate of meningitis should be immunized. A first-year college student up through age 72 years who is living in a residence hall should receive a dose if he did not receive a dose on or after his 16th birthday. Adults who have certain high-risk conditions should receive one or more doses of vaccine.  Hepatitis A vaccine. Adults who wish to be protected from this disease, have chronic liver disease, work with hepatitis A-infected animals, work in hepatitis A research labs, or travel to or work in countries with a high rate of hepatitis A should be immunized. Adults who were previously unvaccinated and who anticipate close contact with an international adoptee during the first 60 days after arrival in the [place] from a country with a high rate of hepatitis A should be immunized.  Hepatitis B vaccine. Adults should be immunized if they wish to be protected from this disease, are under age 3 years and have diabetes, have chronic liver disease, have had more than one sex partner in the past 6 months, may be exposed to blood or other infectious body fluids, are household  contacts or sex partners of hepatitis B positive people, are clients or workers in certain care facilities, or travel to or work in countries with a high rate of hepatitis B.  Haemophilus influenzae type b (Hib) vaccine. A previously unvaccinated person with asplenia or sickle cell disease or having a scheduled splenectomy should receive 1 dose of Hib vaccine. Regardless of previous immunization, a recipient of a hematopoietic stem cell transplant should receive a 3-dose series 6-12 months after his successful transplant. Hib vaccine is not recommended for adults with HIV infection. Preventive Service / Frequency Ages 56 to 71  Blood pressure check.** / Every 3-5 years.  Lipid and cholesterol check.** / Every 5 years beginning at age 73.  Hepatitis C blood test.** / For any individual with known risks for hepatitis C.  Skin self-exam. / Monthly.  Influenza vaccine. / Every year.  Tetanus, diphtheria, and acellular pertussis (Tdap, Td) vaccine.** / Consult your health care provider. 1 dose of Td every 10 years.  Varicella vaccine.** / Consult your health care provider.  HPV vaccine. / 3 doses over 6 months, if 63 or younger.  Measles, mumps, rubella (MMR) vaccine.** / You need at least 1 dose of MMR if you were born in 1957 or later. You may also need a second dose.  Pneumococcal 13-valent conjugate (PCV13) vaccine.** / Consult your health care provider.  Pneumococcal polysaccharide (PPSV23) vaccine.** / 1 to 2 doses if you smoke cigarettes or if you have certain conditions.  Meningococcal vaccine.** / 1 dose if you are age 96 to 68 years and a Market researcher living in a residence hall, or have one of several medical conditions. You may also need additional booster doses.  Hepatitis A vaccine.** / Consult your health care provider.  Hepatitis B vaccine.** / Consult your health care provider.  Haemophilus influenzae type b (Hib) vaccine.** / Consult your health care  provider. Ages 35 to 51  Blood pressure check.** / Every year.  Lipid and cholesterol check.** / Every 5 years beginning at age 42.  Lung cancer screening. / Every year if  you are aged 68-80 years and have a 30-pack-year history of smoking and currently smoke or have quit within the past 15 years. Yearly screening is stopped once you have quit smoking for at least 15 years or develop a health problem that would prevent you from having lung cancer treatment.  Fecal occult blood test (FOBT) of stool. / Every year beginning at age 4 and continuing until age 61. You may not have to do this test if you get a colonoscopy every 10 years.  Flexible sigmoidoscopy** or colonoscopy.** / Every 5 years for a flexible sigmoidoscopy or every 10 years for a colonoscopy beginning at age 16 and continuing until age 33.  Hepatitis C blood test.** / For all people born from [birth year] through 1965 and any individual with known risks for hepatitis C.  Skin self-exam. / Monthly.  Influenza vaccine. / Every year.  Tetanus, diphtheria, and acellular pertussis (Tdap/Td) vaccine.** / Consult your health care provider. 1 dose of Td every 10 years.  Varicella vaccine.** / Consult your health care provider.  Zoster vaccine.** / 1 dose for adults aged 48 years or older.  Measles, mumps, rubella (MMR) vaccine.** / You need at least 1 dose of MMR if you were born in 1959 or later. You may also need a second dose.  Pneumococcal 13-valent conjugate (PCV13) vaccine.** / Consult your health care provider.  Pneumococcal polysaccharide (PPSV23) vaccine.** / 1 to 2 doses if you smoke cigarettes or if you have certain conditions.  Meningococcal vaccine.** / Consult your health care provider.  Hepatitis A vaccine.** / Consult your health care provider.  Hepatitis B vaccine.** / Consult your health care provider.  Haemophilus influenzae type b (Hib) vaccine.** / Consult your health care provider. Ages 79 and over  Blood  pressure check.** / Every year.  Lipid and cholesterol check.**/ Every 5 years beginning at age 77.  Lung cancer screening. / Every year if you are aged 72-80 years and have a 30-pack-year history of smoking and currently smoke or have quit within the past 15 years. Yearly screening is stopped once you have quit smoking for at least 15 years or develop a health problem that would prevent you from having lung cancer treatment.  Fecal occult blood test (FOBT) of stool. / Every year beginning at age 37 and continuing until age 77. You may not have to do this test if you get a colonoscopy every 10 years.  Flexible sigmoidoscopy** or colonoscopy.** / Every 5 years for a flexible sigmoidoscopy or every 10 years for a colonoscopy beginning at age 75 and continuing until age 72.  Hepatitis C blood test.** / For all people born from [birth year] through 1967 and any individual with known risks for hepatitis C.  Abdominal aortic aneurysm (AAA) screening.** / A one-time screening for ages 38 to 78 years who are current or former smokers.  Skin self-exam. / Monthly.  Influenza vaccine. / Every year.  Tetanus, diphtheria, and acellular pertussis (Tdap/Td) vaccine.** / 1 dose of Td every 10 years.  Varicella vaccine.** / Consult your health care provider.  Zoster vaccine.** / 1 dose for adults aged 28 years or older.  Pneumococcal 13-valent conjugate (PCV13) vaccine.** / 1 dose for all adults aged 50 years and older.  Pneumococcal polysaccharide (PPSV23) vaccine.** / 1 dose for all adults aged 75 years and older.  Meningococcal vaccine.** / Consult your health care provider.  Hepatitis A vaccine.** / Consult your health care provider.  Hepatitis B vaccine.** / Consult your health care  provider.  Haemophilus influenzae type b (Hib) vaccine.** / Consult your health care provider. **Family history and personal history of risk and conditions may change your health care provider's recommendations.   This  information is not intended to replace advice given to you by your health care provider. Make sure you discuss any questions you have with your health care provider.   Document Released: 12/22/2001 Document Revised: 11/16/2014 Document Reviewed: 03/23/2011 Elsevier Interactive Patient Education [employer].

## 2017-09-06 ENCOUNTER — Telehealth: Payer: Self-pay | Admitting: *Deleted

## 2017-09-06 ENCOUNTER — Telehealth (HOSPITAL_COMMUNITY): Payer: Self-pay | Admitting: *Deleted

## 2017-09-06 NOTE — Telephone Encounter (Signed)
Left message on voicemail in reference to upcoming appointment scheduled for 09/10/17. Phone number given for a call back so details instructions can be given. Jaiyla Granados, [name]

## 2017-09-06 NOTE — Telephone Encounter (Signed)
Follow Up:     Returning your call, Cody Baxter will answer the phone and she will come to get him for the call.

## 2017-09-07 ENCOUNTER — Telehealth (HOSPITAL_COMMUNITY): Payer: Self-pay

## 2017-09-07 ENCOUNTER — Telehealth (HOSPITAL_COMMUNITY): Payer: Self-pay | Admitting: *Deleted

## 2017-09-07 NOTE — Telephone Encounter (Signed)
Patient given detailed instructions per Myocardial Perfusion Study Information Sheet for the test on 09/10/2017 at 8:15. Patient notified to arrive 15 minutes early and that it is imperative to arrive on time for appointment to keep from having the test rescheduled.  If you need to cancel or reschedule your appointment, please call the office within 24 hours of your appointment. . Patient verbalized understanding.EHK

## 2017-09-07 NOTE — Telephone Encounter (Signed)
Left message on voicemail in reference to upcoming appointment scheduled for 09/10/17. Phone number given for a call back so details instructions can be given. [name], [name]

## 2017-09-10 ENCOUNTER — Ambulatory Visit (HOSPITAL_COMMUNITY): Payer: BLUE CROSS/BLUE SHIELD | Attending: Cardiology

## 2017-09-10 DIAGNOSIS — R9439 Abnormal result of other cardiovascular function study: Secondary | ICD-10-CM | POA: Diagnosis not present

## 2017-09-10 DIAGNOSIS — I2510 Atherosclerotic heart disease of native coronary artery without angina pectoris: Secondary | ICD-10-CM | POA: Diagnosis not present

## 2017-09-10 DIAGNOSIS — Z8249 Family history of ischemic heart disease and other diseases of the circulatory system: Secondary | ICD-10-CM | POA: Diagnosis not present

## 2017-09-10 DIAGNOSIS — R079 Chest pain, unspecified: Secondary | ICD-10-CM | POA: Insufficient documentation

## 2017-09-10 DIAGNOSIS — I10 Essential (primary) hypertension: Secondary | ICD-10-CM | POA: Diagnosis not present

## 2017-09-10 DIAGNOSIS — R0789 Other chest pain: Secondary | ICD-10-CM | POA: Diagnosis not present

## 2017-09-10 LAB — MYOCARDIAL PERFUSION IMAGING
CHL CUP MPHR: 167 {beats}/min
CHL CUP NUCLEAR SSS: 10
CSEPEDS: 0 s
CSEPEW: 8.5 METS
Exercise duration (min): 7 min
LV sys vol: 42 mL
LVDIAVOL: 112 mL (ref 62–150)
Peak HR: 151 {beats}/min
Percent HR: 90 %
RATE: 0.27
Rest HR: 74 {beats}/min
SDS: 3
SRS: 7
TID: 0.87

## 2017-09-10 MED ORDER — TECHNETIUM TC 99M TETROFOSMIN IV KIT
30.3000 | PACK | Freq: Once | INTRAVENOUS | Status: AC | PRN
  Administered 2017-09-10: 30.3 via INTRAVENOUS
  Filled 2017-09-10: qty 31

## 2017-09-10 MED ORDER — TECHNETIUM TC 99M TETROFOSMIN IV KIT
10.5000 | PACK | Freq: Once | INTRAVENOUS | Status: AC | PRN
  Administered 2017-09-10: 10.5 via INTRAVENOUS
  Filled 2017-09-10: qty 11

## 2017-10-08 ENCOUNTER — Encounter: Payer: Self-pay | Admitting: Family Medicine

## 2017-10-08 ENCOUNTER — Ambulatory Visit (INDEPENDENT_AMBULATORY_CARE_PROVIDER_SITE_OTHER): Payer: BLUE CROSS/BLUE SHIELD | Admitting: Family Medicine

## 2017-10-08 VITALS — BP 137/88 | HR 87 | Ht 72.0 in | Wt 220.0 lb

## 2017-10-08 DIAGNOSIS — E559 Vitamin D deficiency, unspecified: Secondary | ICD-10-CM

## 2017-10-08 DIAGNOSIS — J3089 Other allergic rhinitis: Secondary | ICD-10-CM | POA: Insufficient documentation

## 2017-10-08 DIAGNOSIS — E782 Mixed hyperlipidemia: Secondary | ICD-10-CM | POA: Diagnosis not present

## 2017-10-08 DIAGNOSIS — I10 Essential (primary) hypertension: Secondary | ICD-10-CM | POA: Diagnosis not present

## 2017-10-08 DIAGNOSIS — E781 Pure hyperglyceridemia: Secondary | ICD-10-CM | POA: Diagnosis not present

## 2017-10-08 DIAGNOSIS — E786 Lipoprotein deficiency: Secondary | ICD-10-CM | POA: Diagnosis not present

## 2017-10-08 DIAGNOSIS — R7989 Other specified abnormal findings of blood chemistry: Secondary | ICD-10-CM

## 2017-10-08 DIAGNOSIS — E663 Overweight: Secondary | ICD-10-CM

## 2017-10-08 MED ORDER — VITAMIN D3 125 MCG (5000 UT) PO TABS
ORAL_TABLET | ORAL | 3 refills | Status: AC
Start: 2017-10-08 — End: ?

## 2017-10-08 MED ORDER — LOSARTAN POTASSIUM-HCTZ 100-12.5 MG PO TABS: 1.0000 | ORAL_TABLET | Freq: Every day | ORAL | 3 refills | Status: DC

## 2017-10-08 NOTE — Patient Instructions (Addendum)
In 4 weeks from now or so come in for fasting blood work.  Make it after 10/23/2017. CBC, CMP, TSh, T4, FLP, Vit D, A1c.  Make a separate appointment in the near future just for fasting blood work.  Just come and get the blood work and then we will contact you via my chart with the results.   -Please be checking your blood pressure on the new medicines.  Start with 1/2 tablet daily.  If your blood pressure is at goal of less than 130/80 on a regular basis, continue with just 1/2 tablet daily.

## 2017-10-08 NOTE — Progress Notes (Signed)
Impression and Recommendations:    1. HTN, goal below 130/80   2. Hypertriglyceridemia   3. Low HDL (under 40)   4. Mixed hyperlipidemia   5. Elevated TSH   6. Vitamin D insufficiency   7. Overweight (BMI 25.0-29.9)    In 4 weeks from now or so come in for fasting blood work.  Make it after 10/23/2017.   CBC, CMP, TSh, T4, FLP, Vit D, A1c.  Make a separate appointment in the near future just for fasting blood work.  Just come and get the blood work and then we will contact you via my chart with the results.   -Please be checking your blood pressure on the new medicines.  Start with 1/2 tablet daily.  If your blood pressure is at goal of less than 130/80 on a regular basis, continue with just 1/2 tablet daily.  -DC triamterene hydrochlorothiazide and start losartan hydrochlorothiazide  Lifestyle changes such as dash diet and engaging in a regular exercise program discussed with patient.  Educational handouts provided  Ambulatory BP monitoring encouraged. Keep log and bring in next OV  Continue current medication(s).   Also, risks and benefits of medications discussed with patient, including alternative treatments.   Encouraged patient to read drug information handouts to further educate self about the medicine prior to starting it.   Contact us prior with any Q's/ concerns.   Education and routine counseling performed. Handouts provided.   Orders Placed This Encounter  Procedures  . CBC with Differential/Platelet  . Comprehensive metabolic panel  . Lipid panel  . Hemoglobin A1c  . T4, free  . TSH  . T3, free  . VITAMIN D 25 Hydroxy (Vit-D Deficiency, Fractures)     Return in about 4 weeks (around 11/05/2017) for FBW 2-4 wks or so; then 4 mo f/up Bp. .  The patient was counseled, risk factors were discussed, anticipatory guidance given.  Gross side effects, risk and benefits, and alternatives of medications discussed with patient.  Patient is aware that all  medications have potential side effects and we are unable to predict every side effect or drug-drug interaction that may occur.  Expresses verbal understanding and consents to current therapy plan and treatment regimen.  Please see AVS handed out to patient at the end of our visit for further patient instructions/ counseling done pertaining to today's office visit.    Note: This document was prepared using Dragon voice recognition software and may include unintentional dictation errors.     Subjective:    Chief Complaint  Patient presents with  . Follow-up    HPI: Cody Baxter is a 51 y.o. male who presents to [place] at [facility] today for follow up for HTN.     Problem  Htn, Goal Below 130/80  Hld (Hyperlipidemia)  Hypertriglyceridemia  Low Hdl (Under 40)  Environmental and Seasonal Allergies    HTN: - BP never above 140, never below 84-  130's/ 80's.    -Per patient he has been on triamterene hydrochlorothiazide for some time.  However, when he takes that he feels like it hurts his kidneys especially when taking it on consecutive days.  He has been taking it every 3 days it does not bother his "kidneys "as much but he has concerns about this today.  He like to entertain possibly changing medicine since this was a blood pressure medicine from a previous provider.   Per patient he tells me it  makes him pee quite often as well.  -  His blood pressure has not been controlled at home.   - Patient reports good compliance with blood pressure medications  - Denies medication S-E   - Smoking Status noted   - He denies new onset of: chest pain, exercise intolerance, shortness of breath, dizziness, visual changes, headache, lower extremity swelling or claudication.   Today their BP is BP: 137/88   Last 3 blood pressure readings in our office are as follows: BP Readings from Last 3 Encounters:  10/08/17 137/88  09/03/17 (!) 148/98  04/23/17 138/85     Pulse Readings from Last 3 Encounters:  10/08/17 87  09/03/17 68  04/23/17 79    Filed Weights   10/08/17 1034  Weight: 220 lb (99.8 kg)      Patient Care Team    Relationship Specialty Notifications Start End  Mellody Dance, DO PCP - General Family Medicine  04/23/17   Jovita Gamma, MD Consulting Physician Neurosurgery  04/23/17   Rana Snare, MD Consulting Physician Urology  04/23/17   Gatha Mayer, MD Consulting Physician Gastroenterology  04/23/17   Dr. Samara Snide  Optometry  04/26/17   Dr. Michele Mcalpine  Dermatology  04/26/17      Lab Results  Component Value Date   CREATININE 1.07 04/23/2017   BUN 18 04/23/2017   NA 145 (H) 04/23/2017   K 4.5 04/23/2017   CL 104 04/23/2017   CO2 25 04/23/2017    Lab Results  Component Value Date   CHOL 158 04/23/2017    Lab Results  Component Value Date   HDL 31 (L) 04/23/2017    Lab Results  Component Value Date   LDLCALC 65 04/23/2017    Lab Results  Component Value Date   TRIG 310 (H) 04/23/2017    Lab Results  Component Value Date   CHOLHDL 5.1 (H) 04/23/2017    No results found for: LDLDIRECT ===================================================================   Patient Active Problem List   Diagnosis Date Noted  . HTN, goal below 130/80 04/23/2017    Priority: High  . HLD (hyperlipidemia) 09/03/2017    Priority: Medium  . Hypertriglyceridemia 09/03/2017    Priority: Medium  . Low HDL (under 40) 09/03/2017    Priority: Medium  . Environmental and seasonal allergies 10/08/2017    Priority: Low  . Overweight (BMI 25.0-29.9) 09/03/2017  . Nonspecific chest pain 09/03/2017  . Encounter for general adult medical examination with abnormal findings 09/03/2017  . Other chest pain 09/03/2017     Past Medical History:  Diagnosis Date  . Deviated septum   . Hypertension   . Kidney disease      Past Surgical History:  Procedure Laterality Date  . NOSE SURGERY    . TONSILLECTOMY AND  ADENOIDECTOMY  1978   adernoids only     Family History  Problem Relation Age of Onset  . Prostate cancer Father   . Lung cancer Father      Social History   Substance and Sexual Activity  Drug Use No  ,  Social History   Substance and Sexual Activity  Alcohol Use No  . Alcohol/week: 0.0 oz  ,  Social History   Tobacco Use  Smoking Status Never Smoker  Smokeless Tobacco Never Used  ,    Current Outpatient Medications on File Prior to Visit  Medication Sig Dispense Refill  . fenofibrate micronized (LOFIBRA) 134 MG capsule Take 134 mg by mouth daily before breakfast.    .  Flaxseed, Linseed, (FLAX SEEDS PO) Take by mouth.     No current facility-administered medications on file prior to visit.      No Known Allergies   Review of Systems:   General:  Denies fever, chills Optho/Auditory:   Denies visual changes, blurred vision Respiratory:   Denies SOB, cough, wheeze, DIB  Cardiovascular:   Denies chest pain, palpitations, painful respirations Gastrointestinal:   Denies nausea, vomiting, diarrhea.  Endocrine:     Denies new hot or cold intolerance Musculoskeletal:  Denies joint swelling, gait issues, or new unexplained myalgias/ arthralgias Skin:  Denies rash, suspicious lesions  Neurological:    Denies dizziness, unexplained weakness, numbness  Psychiatric/Behavioral:   Denies mood changes  Objective:    Blood pressure 137/88, pulse 87, height 6' (1.829 m), weight 220 lb (99.8 kg).  Body mass index is 29.84 kg/m.  General: Well Developed, well nourished, and in no acute distress.  HEENT: Normocephalic, atraumatic, pupils equal round reactive to light, neck supple, No carotid bruits, no JVD Skin: Warm and dry, cap RF less 2 sec Cardiac: Regular rate and rhythm, S1, S2 WNL's, no murmurs rubs or gallops Respiratory: ECTA B/L, Not using accessory muscles, speaking in full sentences. NeuroM-Sk: Ambulates w/o assistance, moves ext * 4 w/o difficulty, sensation  grossly intact.  Ext: scant edema b/l lower ext Psych: No HI/SI, judgement and insight good, Euthymic mood. Full Affect.

## 2017-11-08 ENCOUNTER — Other Ambulatory Visit: Payer: BLUE CROSS/BLUE SHIELD

## 2017-11-08 ENCOUNTER — Telehealth: Payer: Self-pay | Admitting: Family Medicine

## 2017-11-08 DIAGNOSIS — E782 Mixed hyperlipidemia: Secondary | ICD-10-CM

## 2017-11-08 DIAGNOSIS — I10 Essential (primary) hypertension: Secondary | ICD-10-CM

## 2017-11-08 DIAGNOSIS — E663 Overweight: Secondary | ICD-10-CM

## 2017-11-08 DIAGNOSIS — Z125 Encounter for screening for malignant neoplasm of prostate: Secondary | ICD-10-CM

## 2017-11-08 DIAGNOSIS — E781 Pure hyperglyceridemia: Secondary | ICD-10-CM

## 2017-11-08 DIAGNOSIS — E786 Lipoprotein deficiency: Secondary | ICD-10-CM

## 2017-11-08 DIAGNOSIS — E559 Vitamin D deficiency, unspecified: Secondary | ICD-10-CM | POA: Diagnosis not present

## 2017-11-08 DIAGNOSIS — R7989 Other specified abnormal findings of blood chemistry: Secondary | ICD-10-CM

## 2017-11-08 NOTE — Addendum Note (Signed)
Addended by: Lanier Prude D on: 11/08/2017 05:36 PM   Modules accepted: Orders

## 2017-11-08 NOTE — Telephone Encounter (Signed)
Patient walked back into office after lab work and wanted to tell me to touch base with nurse about making sure a PSA gets added to his lab work today. He said Clarise Cruz already took the needed extra blood for this exam but needs an order for it to process.

## 2017-11-08 NOTE — Telephone Encounter (Signed)
Lab added per Dr. Augusto Gamble. MPulliam, CMA/RT(R)

## 2017-11-09 HISTORY — PX: PROSTATE BIOPSY: SHX241

## 2017-11-09 LAB — CBC WITH DIFFERENTIAL/PLATELET
BASOS: 1 %
Basophils Absolute: 0 10*3/uL (ref 0.0–0.2)
EOS (ABSOLUTE): 0.2 10*3/uL (ref 0.0–0.4)
Eos: 3 %
HEMATOCRIT: 45.1 % (ref 37.5–51.0)
Hemoglobin: 15.3 g/dL (ref 13.0–17.7)
IMMATURE GRANS (ABS): 0 10*3/uL (ref 0.0–0.1)
Immature Granulocytes: 0 %
LYMPHS ABS: 1.4 10*3/uL (ref 0.7–3.1)
Lymphs: 22 %
MCH: 27.5 pg (ref 26.6–33.0)
MCHC: 33.9 g/dL (ref 31.5–35.7)
MCV: 81 fL (ref 79–97)
MONOS ABS: 0.4 10*3/uL (ref 0.1–0.9)
Monocytes: 6 %
NEUTROS ABS: 4.4 10*3/uL (ref 1.4–7.0)
Neutrophils: 68 %
Platelets: 269 10*3/uL (ref 150–379)
RBC: 5.56 x10E6/uL (ref 4.14–5.80)
RDW: 14.3 % (ref 12.3–15.4)
WBC: 6.4 10*3/uL (ref 3.4–10.8)

## 2017-11-09 LAB — COMPREHENSIVE METABOLIC PANEL
A/G RATIO: 1.5 (ref 1.2–2.2)
ALT: 54 IU/L — ABNORMAL HIGH (ref 0–44)
AST: 26 IU/L (ref 0–40)
Albumin: 4.1 g/dL (ref 3.5–5.5)
Alkaline Phosphatase: 96 IU/L (ref 39–117)
BUN / CREAT RATIO: 10 (ref 9–20)
BUN: 11 mg/dL (ref 6–24)
Bilirubin Total: <0.2 mg/dL (ref 0.0–1.2)
CALCIUM: 8.8 mg/dL (ref 8.7–10.2)
CO2: 22 mmol/L (ref 20–29)
Chloride: 105 mmol/L (ref 96–106)
Creatinine, Ser: 1.1 mg/dL (ref 0.76–1.27)
GFR, EST AFRICAN AMERICAN: 88 mL/min/{1.73_m2} (ref >59)
GFR, EST NON AFRICAN AMERICAN: 76 mL/min/{1.73_m2} (ref >59)
GLOBULIN, TOTAL: 2.7 g/dL (ref 1.5–4.5)
Glucose: 100 mg/dL — ABNORMAL HIGH (ref 65–99)
POTASSIUM: 3.8 mmol/L (ref 3.5–5.2)
SODIUM: 144 mmol/L (ref 134–144)
TOTAL PROTEIN: 6.8 g/dL (ref 6.0–8.5)

## 2017-11-09 LAB — HEMOGLOBIN A1C
Est. average glucose Bld gHb Est-mCnc: 108 mg/dL
HEMOGLOBIN A1C: 5.4 % (ref 4.8–5.6)

## 2017-11-09 LAB — LIPID PANEL
CHOLESTEROL TOTAL: 199 mg/dL (ref 100–199)
Chol/HDL Ratio: 8.7 ratio — ABNORMAL HIGH (ref 0.0–5.0)
HDL: 23 mg/dL — AB (ref >39)
TRIGLYCERIDES: 967 mg/dL — AB (ref 0–149)

## 2017-11-09 LAB — TSH: TSH: 4.6 u[IU]/mL — ABNORMAL HIGH (ref 0.450–4.500)

## 2017-11-09 LAB — VITAMIN D 25 HYDROXY (VIT D DEFICIENCY, FRACTURES): Vit D, 25-Hydroxy: 20.2 ng/mL — ABNORMAL LOW (ref 30.0–100.0)

## 2017-11-09 LAB — T4, FREE: FREE T4: 0.94 ng/dL (ref 0.82–1.77)

## 2017-11-09 LAB — PSA: Prostate Specific Ag, Serum: 3.2 ng/mL (ref 0.0–4.0)

## 2017-11-09 LAB — T3, FREE: T3, Free: 2.9 pg/mL (ref 2.0–4.4)

## 2017-11-11 NOTE — Telephone Encounter (Signed)
Please make sure medications were sent into pharmacy.  I wrote a separate note that went to the [facility] and an additional one that went to patient.

## 2017-11-12 ENCOUNTER — Other Ambulatory Visit: Payer: Self-pay

## 2017-11-12 MED ORDER — VITAMIN D (ERGOCALCIFEROL) 1.25 MG (50000 UNIT) PO CAPS: 50000.0000 [IU] | ORAL_CAPSULE | ORAL | 0 refills | Status: DC

## 2018-02-18 ENCOUNTER — Ambulatory Visit (INDEPENDENT_AMBULATORY_CARE_PROVIDER_SITE_OTHER): Payer: BLUE CROSS/BLUE SHIELD | Admitting: Family Medicine

## 2018-02-18 ENCOUNTER — Encounter: Payer: Self-pay | Admitting: Family Medicine

## 2018-02-18 VITALS — BP 129/86 | HR 74 | Ht 72.0 in | Wt 216.0 lb

## 2018-02-18 DIAGNOSIS — I10 Essential (primary) hypertension: Secondary | ICD-10-CM

## 2018-02-18 DIAGNOSIS — E559 Vitamin D deficiency, unspecified: Secondary | ICD-10-CM | POA: Diagnosis not present

## 2018-02-18 DIAGNOSIS — E663 Overweight: Secondary | ICD-10-CM | POA: Diagnosis not present

## 2018-02-18 DIAGNOSIS — E786 Lipoprotein deficiency: Secondary | ICD-10-CM

## 2018-02-18 DIAGNOSIS — R7989 Other specified abnormal findings of blood chemistry: Secondary | ICD-10-CM | POA: Diagnosis not present

## 2018-02-18 DIAGNOSIS — E781 Pure hyperglyceridemia: Secondary | ICD-10-CM

## 2018-02-18 MED ORDER — VITAMIN D (ERGOCALCIFEROL) 1.25 MG (50000 UNIT) PO CAPS: ORAL_CAPSULE | ORAL | 3 refills | Status: DC

## 2018-02-18 NOTE — Progress Notes (Signed)
Impression and Recommendations:    1. HTN, goal below 130/80   2. Hypertriglyceridemia   3. Low HDL (under 40)   4. Elevated TSH   5. Vitamin D insufficiency   6. Overweight (BMI 25.0-29.9)     1. HTN goal <130/80 -BP is controlled in office today. Pt asymptomatic at this time. -Continue checking your BP at home and keeping a log. Bring this into next OV.  -Goal: Have BP be consistently < 130/80.  -From last OV, we started hyzaar and d/c traimeterene-hctz. He reports noncompliance with his medications, taking this only 5 or 6 days a week and not daily. Instructed pt to take this daily, as directed.   2. Hypertriglyceridemia- -Reduce intake of saturated and trans fats, as well as fatty carbohydrates. -recheck fasting blood work. In the past, TG's have been in the 900s. -Pt declines lovaza and niacin (due to SE of flushing). -Pt is not currently taking fenofibrate due to generalized myalgias. -continue taking flaxseed.  3. Low HDL <40 -Recheck fasting blood work. -Encouraged daily exercise, including adding cardio into his exercise regimen.  4. Elevated TSH -Recheck labs.  5. Vitamin D insufficiency -pt told to restart taking OTC vitamin D supplements as well as twice weekly, 50,000 IU supplements. -Continue taking daily multivitamin.  6. Overweight -Recommended pt to lose weight. -Discussed increasing cardio activity into his exercise regimen.  Orders Placed This Encounter  Procedures  . PSA  . Lipid panel  . VITAMIN D 25 Hydroxy (Vit-D Deficiency, Fractures)  . TSH  . T4, free  . Comprehensive metabolic panel  . LDL cholesterol, direct    Meds ordered this encounter  Medications  . Vitamin D, Ergocalciferol, (DRISDOL) 50000 units CAPS capsule    Sig: 1 tab Wed and 1 tab Sun    Dispense:  32 capsule    Refill:  3    Gross side effects, risk and benefits, and alternatives of medications and treatment plan in general discussed with patient.  Patient is  aware that all medications have potential side effects and we are unable to predict every side effect or drug-drug interaction that may occur.   Patient will call with any questions prior to using medication if they have concerns.  Expresses verbal understanding and consents to current therapy and treatment regimen.  No barriers to understanding were identified.  Red flag symptoms and signs discussed in detail.  Patient expressed understanding regarding what to do in case of emergency\urgent symptoms  Please see AVS handed out to patient at the end of our visit for further patient instructions/ counseling done pertaining to today's office visit.   Return for [place] so CPE.    Note: This note was prepared with assistance of Dragon voice recognition software. Occasional wrong-word or sound-a-like substitutions may have occurred due to the inherent limitations of voice recognition software.   This document serves as a record of services personally performed by [name] DO. It was created on her behalf by [name] a trained medical scribe. The creation of this record is based on the scribe's personal observations and the provider's statements to them.   I have reviewed the above medical documentation for accuracy and completeness and I concur.  [name]04/21/19 7:39 PM  --------------------------------------------------------------------------------------------------------------------------------------------------------------------------------------------------------------------------------------------    Subjective:     HPI: Cody Baxter a 52y.o. male who presents to [facility] for issues as discussed below including HTN.  He takes vitamin D only from his daily Men's over 50 centrum multivitamin- he is not taking his daily vitamin d supplement or weekly.  He is not taking fenofibrate due to leg cramps.   He is taking flax  seed either 1000 or 2000 mg daily.   He does exercise 1-2 days a week but not regularly. Per pt, he does not get as much cardio as he needs. He gets a fully body massage every week.   CHOL HPI:   -  He  is currently managed with:  See med list from today  Txmnt compliance- no- discontinued fenofibrate. He declines lovaza and niacin (due to flushing SE). He states he eats a lot of lobster and seafoods, with red meats often.  Exercises 1-2 days a week.   Muscle aches- yes after taking fenofibrate- he has stopped this since.   No other s-e  Last lipid panel as follows:  Lab Results  Component Value Date   CHOL 176 02/18/2018   HDL 31 (L) 02/18/2018   LDLCALC 69 02/18/2018   LDLDIRECT 91 02/18/2018   TRIG 382 (H) 02/18/2018   CHOLHDL 5.7 (H) 02/18/2018    Hepatic Function Latest Ref Rng & Units 02/18/2018 11/08/2017 04/23/2017  Total Protein 6.0 - 8.5 g/dL 7.5 6.8 7.3  Albumin 3.5 - 5.5 g/dL 5.0 4.1 5.0  AST 0 - 40 IU/L '22 26 19  '$ ALT 0 - 44 IU/L 24 54(H) 21  Alk Phosphatase 39 - 117 IU/L 89 96 92  Total Bilirubin 0.0 - 1.2 mg/dL 0.6 <0.2 0.4   PSA Per pt he reports 3.4 was his highest. He has 2 gridded biopsies of his prostate where one had atypical results and another was WNL.  His father has a h/o prostate CA. He has an appointment with [facility] in 2 weeks with Dr. Lovena Neighbours.  Other providers He has a dermatologist, Dr. Michele Mcalpine, and optometrist, Dr. Samara Snide.   -His pharmacy has changed from [place] to [place].   HTN HPI:  -From last OV from 11-08-17 we d/c triamterene-hctz and added losartan-hctz combo  -  His blood pressure has been controlled at home.  Pt is checking it at home.   BP's at home are 122-124/82-86.  - Patient reports poor compliance with blood pressure medications. He takes losartan-hctz only 5-6 days a week. He does not take this daily.  He denies SOB, CP. He has had a workup in the past before with results WNL.   - Denies  medication S-E.   - Smoking Status noted.  - He denies new onset of: chest pain, exercise intolerance, shortness of breath, dizziness, visual changes, headache, lower extremity swelling or claudication.    Last 3 blood pressure readings in our office are as follows: BP Readings from Last 3 Encounters:  02/18/18 129/86  10/08/17 137/88  09/03/17 (!) 148/98    Filed Weights   02/18/18 0938  Weight: 216 lb (98 kg)    Wt Readings from Last 3 Encounters:  02/18/18 216 lb (98 kg)  10/08/17 220 lb (99.8 kg)  09/03/17 219 lb (99.3 kg)   BP Readings from Last 3 Encounters:  02/18/18 129/86  10/08/17 137/88  09/03/17 (!) 148/98   Pulse Readings from Last 3 Encounters:  02/18/18 74  10/08/17 87  09/03/17 68   BMI Readings from Last 3 Encounters:  02/18/18 29.29 kg/m  10/08/17 29.84 kg/m  09/03/17 29.70 kg/m     Patient Care Team    Relationship  Specialty Notifications Start End  Mellody Dance, DO PCP - General Family Medicine  04/23/17   Jovita Gamma, MD Consulting Physician Neurosurgery  04/23/17   Rana Snare, MD Consulting Physician Urology  04/23/17   Gatha Mayer, MD Consulting Physician Gastroenterology  04/23/17   Dr. Samara Snide  Optometry  04/26/17   Dr. Michele Mcalpine  Dermatology  04/26/17   Ceasar Mons, MD Consulting Physician Urology  02/18/18      Patient Active Problem List   Diagnosis Date Noted  . HTN, goal below 130/80 04/23/2017    Priority: High  . HLD (hyperlipidemia) 09/03/2017    Priority: Medium  . Hypertriglyceridemia 09/03/2017    Priority: Medium  . Low HDL (under 40) 09/03/2017    Priority: Medium  . Environmental and seasonal allergies 10/08/2017    Priority: Low  . Elevated TSH 02/18/2018  . Vitamin D insufficiency 02/18/2018  . Overweight (BMI 25.0-29.9) 09/03/2017  . Nonspecific chest pain 09/03/2017  . Encounter for general adult medical examination with abnormal findings 09/03/2017  . Other chest pain 09/03/2017     Past Medical history, Surgical history, Family history, Social history, Allergies and Medications have been entered into the medical record, reviewed and changed as needed.    Current Meds  Medication Sig  . Flaxseed, Linseed, (FLAX SEEDS PO) Take by mouth.  . losartan-hydrochlorothiazide (HYZAAR) 100-12.5 MG tablet Take 1 tablet by mouth daily.    Allergies:  No Known Allergies   Review of Systems:  A fourteen system review of systems was performed and found to be positive as per HPI.   Objective:   Blood pressure 129/86, pulse 74, height 6' (1.829 m), weight 216 lb (98 kg), SpO2 98 %. Body mass index is 29.29 kg/m. General:  Well Developed, well nourished, appropriate for stated age.  Neuro:  Alert and oriented,  extra-ocular muscles intact  HEENT:  Normocephalic, atraumatic, neck supple, no carotid bruits appreciated  Skin:  no gross rash, warm, pink. Cardiac:  RRR, S1 S2 Respiratory:  ECTA B/L and A/P, Not using accessory muscles, speaking in full sentences- unlabored. Vascular:  Ext warm, no cyanosis apprec.; cap RF less 2 sec. Psych:  No HI/SI, judgement and insight good, Euthymic mood. Full Affect.

## 2018-02-18 NOTE — Patient Instructions (Signed)
The quick and dirty--> lower triglyceride levels more through...  1) - Beware of bad fats: Cutting back on saturated fat (in red meat and full-fat dairy foods) and trans fats (in restaurant fried foods and commercially prepared baked goods) can lower triglycerides.  2) - Go for good carbs: Easily digested carbohydrates (such as white bread, white rice, cornflakes, and sugary sodas) give triglycerides a definite boost.   3) - Eating whole grains and cutting back on soda can help control triglycerides.  4) - Check your alcohol use. In some people, alcohol dramatically boosts triglycerides. The only way to know if this is true for you is to avoid alcohol for a few weeks and have your triglycerides tested again.  5) - Go fish. Omega-3 fats in salmon, tuna, sardines, and other fatty fish can lower triglycerides. Having fish twice a week is fine.  6) - Aim for a healthy weight. If you are overweight, losing just 5% to 10% of your weight can help drive down triglycerides.  7) - Get moving. Exercise lowers triglycerides and boosts heart-healthy HDL cholesterol.  8) - quit smoking if you do  --> for more information, see below; or go to  www.heart.org  and do a search for desired topics   For those diagnosed with high triglycerides, it's important to take action to lower your levels and improve your heart health.  Triglyceride is just a fancy word for fat - the fat in our bodies is stored in the form of triglycerides. Triglycerides are found in foods and manufactured in our bodies.  Normal triglyceride levels are defined as less than 150 mg/dL; 150 to 199 is considered borderline high; 200 to 499 is high; and 500 or higher is officially called very high. To me, anything over 150 is a red flag indicating my patient needs to take immediate steps to get the situation under control.   What is the significance of high triglycerides? High triglyceride levels make blood thicker and stickier, which means  that it is more likely to form clots. Studies have shown that triglyceride levels are associated with increased risks of cardiovascular disease and stroke - in both men and women - alone or in combination with other risk factors (high triglycerides combined with high LDL cholesterol can be a particularly deadly combination). For example, in one ground-breaking study, high triglycerides alone increased the risk of cardiovascular disease by 14 percent in men, and by 59 percent in women. But when the test subjects also had low HDL cholesterol (that's the good cholesterol) and other risk factors, high triglycerides increased the risk of disease by 32 percent in men and 76 percent in women.   Fortunately, triglycerides can sometimes be controlled with several diet and lifestyle changes.    What Factors Can Increase Triglycerides? As with cholesterol, eating too much of the wrong kinds of fats will raise your blood triglycerides.  Therefore, it's important to restrict the amounts of saturated fats and trans fats you allow into your diet.  Triglyceride levels can also shoot up after eating foods that are high in carbohydrates or after drinking alcohol.  That's why triglyceride blood tests require an overnight fast.  If you have elevated triglycerides, it's especially important to avoid sugary and refined carbohydrates, including sugar, honey, and other sweeteners, soda and other sugary drinks, candy, baked goods, and anything made with white (refined or enriched) flour, including white bread, rolls, cereals, buns, pastries, regular pasta, and white rice.  You'll also want to limit dried  fruit and fruit juice since they're dense in simple sugar.  All of these low-quality carbs cause a sudden rise in insulin, which may lead to a spike in triglycerides.  Triglycerides can also become elevated as a reaction to having diabetes, hypothyroidism, or kidney disease. As with most other heart-related factors, being overweight  and inactive also contribute to abnormal triglycerides. And unfortunately, some people have a genetic predisposition that causes them to manufacture way too much triglycerides on their own, no matter how carefully they eat.     How Can You Lower Your Triglyceride Levels? If you are diagnosed with high triglycerides, it's important to take action. There are several things you can do to help lower your triglyceride levels and improve your heart health:  --> Lose weight if you are overweight.  There is a clear correlation between obesity and high triglycerides - the heavier people are, the higher their triglyceride levels are likely to be. The good news is that losing weight can significantly lower triglycerides. In a large study of individuals with type 2 diabetes, those assigned to the "lifestyle intervention group" - which involved counseling, a low-calorie meal plan, and customized exercise program - lost 8.6% of their body weight and lowered their triglyceride levels by more than 16%. If you're overweight, find a weight loss plan that works for you and commit to shedding the pounds and getting healthier.  --> Reduce the amount of saturated fat and trans fat in your diet.  Start by avoiding or dramatically limiting butter, cream cheese, lard, sour cream, doughnuts, cakes, cookies, candy bars, regular ice cream, fried foods, pizza, cheese sauce, cream-based sauces and salad dressings, high-fat meats (including fatty hamburgers, bologna, pepperoni, sausage, bacon, salami, pastrami, spareribs, and hot dogs), high-fat cuts of beef and pork, and whole-milk dairy products.   Other ways to cut back: Choose lean meats only (including skinless chicken and [place], lean beef, lean pork), fish, and reduced-fat or fat-free dairy products.   Experiment with adding whole soy foods to your diet. Although soy itself may not reduce risk of heart disease, it replaces hazardous animal fats with healthier proteins.  Choose high-quality soy foods, such as tofu, tempeh, soy milk, and edamame (whole soybeans).  Always remove skin from poultry.  Prepare foods by baking, roasting, broiling, boiling, poaching, steaming, grilling, or stir-frying in vegetable oil.  Most stick margarines contain trans fats, and trans fats are also found in some packaged baked goods, potato chips, snack foods, fried foods, and fast food that use or create hydrogenated oils.    (All food labels must now list the amount of trans fats, right after the amount of saturated fats - good news for consumers. As a result, many food companies have now reformulated their products to be trans fat free.many, but not all! So it's still just as important to read labels and make sure the packaged foods you buy don't contain trans fats.)     If you use margarine, purchase soft-tub margarine spreads that contain 0 grams trans fats and don't list any partially hydrogenated oils in the ingredients list. By substituting olive oil or vegetable oil for trans fats in just 2 percent of your daily calories, you can reduce your risk of heart disease by 53 percent.   There is no safe amount of trans fats, so try to keep them as far from your plate as possible.  -->  Avoid foods that are concentrated in sugar (even dried fruit and fruit juice). Sugary foods can  elevate triglyceride levels in the blood, so keep them to a bare minimum.  --> Swap out refined carbohydrates for whole grains.  Refined carbohydrates - like white rice, regular pasta, and anything made with white or "enriched" flour (including white bread, rolls, cereals, buns, and crackers) - raise blood sugar and insulin levels more than fiber-rich whole grains. Higher insulin levels, in turn, can lead to a higher rise in triglycerides after a meal. So, make the switch to whole wheat bread, whole grain pasta, brown or wild rice, and whole grain versions of cereals, crackers, and other bread products. However,  it's important to know that individuals with high triglycerides should moderate even their intake of high-quality starches (since all starches raise blood sugar) - I recommend 1 to 2 servings per meal.  --> Cut way back on alcohol.  If you have high triglycerides, alcohol should be considered a rare treat - if you indulge at all, since even small amounts of alcohol can dramatically increase triglyceride levels.  --> Incorporate omega-3 fats.  Heart-healthy fish oils are especially rich in omega-3 fatty acids. In multiple studies over the past two decades, people who ate diets high in omega-3s had 30 to 40 percent reductions in heart disease. Although we don't yet know why fish oil works so well, there are several possibilities. Omega-3s seem to reduce inflammation, reduce high blood pressure, decrease triglycerides, raise HDL cholesterol, and make blood thinner and less sticky so it is less likely to clot. It's as close to a food prescription for heart health as it gets. If you have high triglycerides, I recommend eating at least three servings of one of the omega-3-rich fish every week (fatty fish is the most concentrated food form of omega three fats). If you cannot manage to eat that much fish, speak with your physician about taking fish oil capsules, which offer similar benefits.The best foods for omega-3 fatty acids include wild salmon (fresh, canned), herring, mackerel (not king), sardines, anchovies, rainbow trout, and Pacific oysters. Non-fish sources of omega-3 fats include omega-3-fortified eggs, ground flaxseed, chia seeds, walnuts, butternuts (white walnuts), seaweed, walnut oil, canola oil, and soybeans.  --> Quit smoking.  Smoking causes inflammation, not just in your lungs, but throughout your body. Inflammation can contribute to atherosclerosis, blood clots, and risk of heart attack. Smoking makes all heart health indicators worse. If you have high cholesterol, high triglycerides, or high  blood pressure, smoking magnifies the danger.  --> Become more physically active.  Even moderate exercise can help improve cholesterol, triglycerides, and blood pressure. Aerobic exercise seems to be able to stop the sharp rise of triglycerides after eating, perhaps because of a decrease in the amount of triglyceride released by the liver, or because active muscle clears triglycerides out of the blood stream more quickly than inactive muscle. If you haven't exercised regularly (or at all) for years, I recommend starting slowly, by walking at an easy pace for 15 minutes a day. Then, as you feel more comfortable, increase the amount. Your ultimate goal should be at least 30 minutes of moderate physical activity, at least five days a week.  Guidelines for a Low Cholesterol, Low Saturated Fat Diet   Fats - Limit total intake of fats and oils. - Avoid butter, stick margarine, shortening, lard, palm and coconut oils. - Limit mayonnaise, salad dressings, gravies and sauces, unless they are homemade with low-fat ingredients. - Limit chocolate. - Choose low-fat and nonfat products, such as low-fat mayonnaise, low-fat or non-hydrogenated peanut butter,  low-fat or fat-free salad dressings and nonfat gravy. - Use vegetable oil, such as canola or olive oil. - Look for margarine that does not contain trans fatty acids. - Use nuts in moderate amounts. - Read ingredient labels carefully to determine both amount and type of fat present in foods. Limit saturated and trans fats! - Avoid high-fat processed and convenience foods.  Meats and Meat Alternatives - Choose fish, chicken, [place] and lean meats. - Use dried beans, peas, lentils and tofu. - Limit egg yolks to three to four per week. - If you eat red meat, limit to no more than three servings per week and choose loin or round cuts. - Avoid fatty meats, such as bacon, sausage, franks, luncheon meats and ribs. - Avoid all organ meats, including  liver.  Dairy - Choose nonfat or low-fat milk, yogurt and cottage cheese. - Most cheeses are high in fat. Choose cheeses made from non-fat milk, such as mozzarella and ricotta cheese. - Choose light or fat-free cream cheese and sour cream. - Avoid cream and sauces made with cream.  Fruits and Vegetables - Eat a wide variety of fruits and vegetables. - Use lemon juice, vinegar or "mist" olive oil on vegetables. - Avoid adding sauces, fat or oil to vegetables.  Breads, Cereals and Grains - Choose whole-grain breads, cereals, pastas and rice. - Avoid high-fat snack foods, such as granola, cookies, pies, pastries, doughnuts and croissants.  Cooking Tips - Avoid deep fried foods. - Trim visible fat off meats and remove skin from poultry before cooking. - Bake, broil, boil, poach or roast poultry, fish and lean meats. - Drain and discard fat that drains out of meat as you cook it. - Add little or no fat to foods. - Use vegetable oil sprays to grease pans for cooking or baking. - Steam vegetables. - Use herbs or no-oil marinades to flavor foods.

## 2018-02-19 LAB — LIPID PANEL
CHOL/HDL RATIO: 5.7 ratio — AB (ref 0.0–5.0)
Cholesterol, Total: 176 mg/dL (ref 100–199)
HDL: 31 mg/dL — ABNORMAL LOW (ref >39)
LDL CALC: 69 mg/dL (ref 0–99)
TRIGLYCERIDES: 382 mg/dL — AB (ref 0–149)
VLDL CHOLESTEROL CAL: 76 mg/dL — AB (ref 5–40)

## 2018-02-19 LAB — COMPREHENSIVE METABOLIC PANEL
A/G RATIO: 2 (ref 1.2–2.2)
ALT: 24 IU/L (ref 0–44)
AST: 22 IU/L (ref 0–40)
Albumin: 5 g/dL (ref 3.5–5.5)
Alkaline Phosphatase: 89 IU/L (ref 39–117)
BILIRUBIN TOTAL: 0.6 mg/dL (ref 0.0–1.2)
BUN / CREAT RATIO: 15 (ref 9–20)
BUN: 17 mg/dL (ref 6–24)
CALCIUM: 9.7 mg/dL (ref 8.7–10.2)
CHLORIDE: 101 mmol/L (ref 96–106)
CO2: 26 mmol/L (ref 20–29)
Creatinine, Ser: 1.13 mg/dL (ref 0.76–1.27)
GFR, EST AFRICAN AMERICAN: 85 mL/min/{1.73_m2} (ref >59)
GFR, EST NON AFRICAN AMERICAN: 74 mL/min/{1.73_m2} (ref >59)
GLOBULIN, TOTAL: 2.5 g/dL (ref 1.5–4.5)
Glucose: 104 mg/dL — ABNORMAL HIGH (ref 65–99)
POTASSIUM: 4.8 mmol/L (ref 3.5–5.2)
Sodium: 142 mmol/L (ref 134–144)
TOTAL PROTEIN: 7.5 g/dL (ref 6.0–8.5)

## 2018-02-19 LAB — PSA
PSA: 3.6
Prostate Specific Ag, Serum: 3.6 ng/mL (ref 0.0–4.0)

## 2018-02-19 LAB — TSH: TSH: 5.12 u[IU]/mL — ABNORMAL HIGH (ref 0.450–4.500)

## 2018-02-19 LAB — VITAMIN D 25 HYDROXY (VIT D DEFICIENCY, FRACTURES): Vit D, 25-Hydroxy: 27.7 ng/mL — ABNORMAL LOW (ref 30.0–100.0)

## 2018-02-19 LAB — T4, FREE: FREE T4: 1.29 ng/dL (ref 0.82–1.77)

## 2018-02-19 LAB — LDL CHOLESTEROL, DIRECT: LDL Direct: 91 mg/dL (ref 0–99)

## 2018-03-04 ENCOUNTER — Ambulatory Visit: Payer: BLUE CROSS/BLUE SHIELD | Admitting: Family Medicine

## 2018-03-04 DIAGNOSIS — R3911 Hesitancy of micturition: Secondary | ICD-10-CM | POA: Diagnosis not present

## 2018-03-04 DIAGNOSIS — N401 Enlarged prostate with lower urinary tract symptoms: Secondary | ICD-10-CM | POA: Diagnosis not present

## 2018-03-04 DIAGNOSIS — R972 Elevated prostate specific antigen [PSA]: Secondary | ICD-10-CM | POA: Diagnosis not present

## 2018-03-04 DIAGNOSIS — R350 Frequency of micturition: Secondary | ICD-10-CM | POA: Diagnosis not present

## 2018-03-07 ENCOUNTER — Other Ambulatory Visit: Payer: Self-pay | Admitting: Urology

## 2018-03-07 DIAGNOSIS — R972 Elevated prostate specific antigen [PSA]: Secondary | ICD-10-CM

## 2018-04-07 DIAGNOSIS — H5212 Myopia, left eye: Secondary | ICD-10-CM | POA: Diagnosis not present

## 2018-04-08 ENCOUNTER — Ambulatory Visit
Admission: RE | Admit: 2018-04-08 | Discharge: 2018-04-08 | Disposition: A | Discharge status: Home / Self Care | Payer: BLUE CROSS/BLUE SHIELD | Source: Ambulatory Visit | Attending: Urology | Admitting: Urology

## 2018-04-08 DIAGNOSIS — R972 Elevated prostate specific antigen [PSA]: Secondary | ICD-10-CM | POA: Diagnosis not present

## 2018-04-08 MED ORDER — GADOBENATE DIMEGLUMINE 529 MG/ML IV SOLN
20.0000 mL | Freq: Once | INTRAVENOUS | Status: AC | PRN
  Administered 2018-04-08: 20 mL via INTRAVENOUS

## 2018-04-15 DIAGNOSIS — N4231 Prostatic intraepithelial neoplasia: Secondary | ICD-10-CM | POA: Diagnosis not present

## 2018-04-15 DIAGNOSIS — R350 Frequency of micturition: Secondary | ICD-10-CM | POA: Diagnosis not present

## 2018-04-15 DIAGNOSIS — R972 Elevated prostate specific antigen [PSA]: Secondary | ICD-10-CM | POA: Diagnosis not present

## 2018-04-15 DIAGNOSIS — N401 Enlarged prostate with lower urinary tract symptoms: Secondary | ICD-10-CM | POA: Diagnosis not present

## 2018-04-29 DIAGNOSIS — M25512 Pain in left shoulder: Secondary | ICD-10-CM | POA: Diagnosis not present

## 2018-05-06 DIAGNOSIS — R972 Elevated prostate specific antigen [PSA]: Secondary | ICD-10-CM | POA: Diagnosis not present

## 2018-05-07 DIAGNOSIS — M25512 Pain in left shoulder: Secondary | ICD-10-CM | POA: Diagnosis not present

## 2018-05-11 DIAGNOSIS — S40012D Contusion of left shoulder, subsequent encounter: Secondary | ICD-10-CM | POA: Diagnosis not present

## 2018-05-18 ENCOUNTER — Telehealth: Payer: Self-pay | Admitting: Family Medicine

## 2018-05-18 ENCOUNTER — Other Ambulatory Visit: Payer: Self-pay

## 2018-05-18 DIAGNOSIS — I10 Essential (primary) hypertension: Secondary | ICD-10-CM

## 2018-05-18 DIAGNOSIS — E559 Vitamin D deficiency, unspecified: Secondary | ICD-10-CM

## 2018-05-18 MED ORDER — LOSARTAN POTASSIUM-HCTZ 100-12.5 MG PO TABS: 1.0000 | ORAL_TABLET | Freq: Every day | ORAL | 3 refills | Status: DC

## 2018-05-18 MED ORDER — VITAMIN D (ERGOCALCIFEROL) 1.25 MG (50000 UNIT) PO CAPS: ORAL_CAPSULE | ORAL | 3 refills | Status: DC

## 2018-05-18 NOTE — Telephone Encounter (Signed)
Refills sent in and patient notified. MPulliam, CMA/RT(R)  

## 2018-05-18 NOTE — Telephone Encounter (Signed)
Patient request refills on Losartan and Vitamin D.  Reviewed chart and sent in refills to [facility]. MPulliam, CMA/RT(R)

## 2018-05-18 NOTE — Telephone Encounter (Signed)
Patient is called requesting a refill of his losartan and Vit D. He is currently completely out of the Vit D and has less than a weeks worth of the losartan. He is requesting these be sent to [facility] [place] (his old pharmacy that we were using was [facility] which he no longer uses). Please advise

## 2018-06-10 ENCOUNTER — Encounter: Payer: Self-pay | Admitting: Family Medicine

## 2018-06-10 ENCOUNTER — Ambulatory Visit (INDEPENDENT_AMBULATORY_CARE_PROVIDER_SITE_OTHER): Payer: BLUE CROSS/BLUE SHIELD | Admitting: Family Medicine

## 2018-06-10 VITALS — BP 130/84 | HR 83 | Ht 72.0 in | Wt 222.5 lb

## 2018-06-10 DIAGNOSIS — E786 Lipoprotein deficiency: Secondary | ICD-10-CM | POA: Diagnosis not present

## 2018-06-10 DIAGNOSIS — Z0000 Encounter for general adult medical examination without abnormal findings: Secondary | ICD-10-CM | POA: Diagnosis not present

## 2018-06-10 DIAGNOSIS — E559 Vitamin D deficiency, unspecified: Secondary | ICD-10-CM | POA: Diagnosis not present

## 2018-06-10 DIAGNOSIS — R7989 Other specified abnormal findings of blood chemistry: Secondary | ICD-10-CM

## 2018-06-10 DIAGNOSIS — E782 Mixed hyperlipidemia: Secondary | ICD-10-CM | POA: Diagnosis not present

## 2018-06-10 DIAGNOSIS — E781 Pure hyperglyceridemia: Secondary | ICD-10-CM

## 2018-06-10 DIAGNOSIS — Z0001 Encounter for general adult medical examination with abnormal findings: Secondary | ICD-10-CM

## 2018-06-10 NOTE — Patient Instructions (Addendum)
Please make sure you are going for yearly skin screenings to a dermatologist.  Also I recommend you call your insurance and ask about the Shingrix vaccine.  Find out if you can get it here or at a specialty pharmacy or what restrictions your insurance imposes upon that.     Preventive Care for Adults, Male A healthy lifestyle and preventive care can promote health and wellness. Preventive health guidelines for men include the following key practices:  A routine yearly physical is a good way to check with your health care provider about your health and preventative screening. It is a chance to share any concerns and updates on your health and to receive a thorough exam.  Visit your dentist for a routine exam and preventative care every 6 months. Brush your teeth twice a day and floss once a day. Good oral hygiene prevents tooth decay and gum disease.  The frequency of eye exams is based on your age, health, family medical history, use of contact lenses, and other factors. Follow your health care provider's recommendations for frequency of eye exams.  Eat a healthy diet. Foods such as vegetables, fruits, whole grains, low-fat dairy products, and lean protein foods contain the nutrients you need without too many calories. Decrease your intake of foods high in solid fats, added sugars, and salt. Eat the right amount of calories for you.Get information about a proper diet from your health care provider, if necessary.  Regular physical exercise is one of the most important things you can do for your health. Most adults should get at least 150 minutes of moderate-intensity exercise (any activity that increases your heart rate and causes you to sweat) each week. In addition, most adults need muscle-strengthening exercises on 2 or more days a week.  Maintain a healthy weight. The body mass index (BMI) is a screening tool to identify possible weight problems. It provides an estimate of body fat based on  height and weight. Your health care provider can find your BMI and can help you achieve or maintain a healthy weight.For adults 20 years and older:  A BMI below 18.5 is considered underweight.  A BMI of 18.5 to 24.9 is normal.  A BMI of 25 to 29.9 is considered overweight.  A BMI of 30 and above is considered obese.  Maintain normal blood lipids and cholesterol levels by exercising and minimizing your intake of saturated fat. Eat a balanced diet with plenty of fruit and vegetables. Blood tests for lipids and cholesterol should begin at age 75 and be repeated every 5 years. If your lipid or cholesterol levels are high, you are over 50, or you are at high risk for heart disease, you may need your cholesterol levels checked more frequently.Ongoing high lipid and cholesterol levels should be treated with medicines if diet and exercise are not working.  If you smoke, find out from your health care provider how to quit. If you do not use tobacco, do not start.  Lung cancer screening is recommended for adults aged 11-80 years who are at high risk for developing lung cancer because of a history of smoking. A yearly low-dose CT scan of the lungs is recommended for people who have at least a 30-pack-year history of smoking and are a current smoker or have quit within the past 15 years. A pack year of smoking is smoking an average of 1 pack of cigarettes a day for 1 year (for example: 1 pack a day for 30 years or 2  packs a day for 15 years). Yearly screening should continue until the smoker has stopped smoking for at least 15 years. Yearly screening should be stopped for people who develop a health problem that would prevent them from having lung cancer treatment.  If you choose to drink alcohol, do not have more than 2 drinks per day. One drink is considered to be 12 ounces (355 mL) of beer, 5 ounces (148 mL) of wine, or 1.5 ounces (44 mL) of liquor.  Avoid use of street drugs. Do not share needles with  anyone. Ask for help if you need support or instructions about stopping the use of drugs.  High blood pressure causes heart disease and increases the risk of stroke. Your blood pressure should be checked at least every 1-2 years. Ongoing high blood pressure should be treated with medicines, if weight loss and exercise are not effective.  If you are 48-75 years old, ask your health care provider if you should take aspirin to prevent heart disease.  Diabetes screening is done by taking a blood sample to check your blood glucose level after you have not eaten for a certain period of time (fasting). If you are not overweight and you do not have risk factors for diabetes, you should be screened once every 3 years starting at age 50. If you are overweight or obese and you are 43-56 years of age, you should be screened for diabetes every year as part of your cardiovascular risk assessment.  Colorectal cancer can be detected and often prevented. Most routine colorectal cancer screening begins at the age of 20 and continues through age 60. However, your health care provider may recommend screening at an earlier age if you have risk factors for colon cancer. On a yearly basis, your health care provider may provide home test kits to check for hidden blood in the stool. Use of a small camera at the end of a tube to directly examine the colon (sigmoidoscopy or colonoscopy) can detect the earliest forms of colorectal cancer. Talk to your health care provider about this at age 1, when routine screening begins. Direct exam of the colon should be repeated every 5-10 years through age 46, unless early forms of precancerous polyps or small growths are found.  People who are at an increased risk for hepatitis B should be screened for this virus. You are considered at high risk for hepatitis B if:  You were born in a country where hepatitis B occurs often. Talk with your health care provider about which countries are  considered high risk.  Your parents were born in a high-risk country and you have not received a shot to protect against hepatitis B (hepatitis B vaccine).  You have HIV or AIDS.  You use needles to inject street drugs.  You live with, or have sex with, someone who has hepatitis B.  You are a man who has sex with other men (MSM).  You get hemodialysis treatment.  You take certain medicines for conditions such as cancer, organ transplantation, and autoimmune conditions.  Hepatitis C blood testing is recommended for all people born from [birth year] through 1965 and any individual with known risks for hepatitis C.  Practice safe sex. Use condoms and avoid high-risk sexual practices to reduce the spread of sexually transmitted infections (STIs). STIs include gonorrhea, chlamydia, syphilis, trichomonas, herpes, HPV, and human immunodeficiency virus (HIV). Herpes, HIV, and HPV are viral illnesses that have no cure. They can result in disability, cancer, and death.  If you are a man who has sex with other men, you should be screened at least once per year for:  HIV.  Urethral, rectal, and pharyngeal infection of gonorrhea, chlamydia, or both.  If you are at risk of being infected with HIV, it is recommended that you take a prescription medicine daily to prevent HIV infection. This is called preexposure prophylaxis (PrEP). You are considered at risk if:  You are a man who has sex with other men (MSM) and have other risk factors.  You are a heterosexual man, are sexually active, and are at increased risk for HIV infection.  You take drugs by injection.  You are sexually active with a partner who has HIV.  Talk with your health care provider about whether you are at high risk of being infected with HIV. If you choose to begin PrEP, you should first be tested for HIV. You should then be tested every 3 months for as long as you are taking PrEP.  A one-time screening for abdominal aortic aneurysm  (AAA) and surgical repair of large AAAs by ultrasound are recommended for men ages 23 to 75 years who are current or former smokers.  Healthy men should no longer receive prostate-specific antigen (PSA) blood tests as part of routine cancer screening. Talk with your health care provider about prostate cancer screening.  Testicular cancer screening is not recommended for adult males who have no symptoms. Screening includes self-exam, a health care provider exam, and other screening tests. Consult with your health care provider about any symptoms you have or any concerns you have about testicular cancer.  Use sunscreen. Apply sunscreen liberally and repeatedly throughout the day. You should seek shade when your shadow is shorter than you. Protect yourself by wearing long sleeves, pants, a wide-brimmed hat, and sunglasses year round, whenever you are outdoors.  Once a month, do a whole-body skin exam, using a mirror to look at the skin on your back. Tell your health care provider about new moles, moles that have irregular borders, moles that are larger than a pencil eraser, or moles that have changed in shape or color.  Stay current with required vaccines (immunizations).  Influenza vaccine. All adults should be immunized every year.  Tetanus, diphtheria, and acellular pertussis (Td, Tdap) vaccine. An adult who has not previously received Tdap or who does not know his vaccine status should receive 1 dose of Tdap. This initial dose should be followed by tetanus and diphtheria toxoids (Td) booster doses every 10 years. Adults with an unknown or incomplete history of completing a 3-dose immunization series with Td-containing vaccines should begin or complete a primary immunization series including a Tdap dose. Adults should receive a Td booster every 10 years.  Varicella vaccine. An adult without evidence of immunity to varicella should receive 2 doses or a second dose if he has previously received 1  dose.  Human papillomavirus (HPV) vaccine. Males aged 11-21 years who have not received the vaccine previously should receive the 3-dose series. Males aged 22-26 years may be immunized. Immunization is recommended through the age of 58 years for any male who has sex with males and did not get any or all doses earlier. Immunization is recommended for any person with an immunocompromised condition through the age of 11 years if he did not get any or all doses earlier. During the 3-dose series, the second dose should be obtained 4-8 weeks after the first dose. The third dose should be obtained 24 weeks after  the first dose and 16 weeks after the second dose.  Zoster vaccine. One dose is recommended for adults aged 15 years or older unless certain conditions are present.  Measles, mumps, and rubella (MMR) vaccine. Adults born before [birth year] generally are considered immune to measles and mumps. Adults born in [birth year] or later should have 1 or more doses of MMR vaccine unless there is a contraindication to the vaccine or there is laboratory evidence of immunity to each of the three diseases. A routine second dose of MMR vaccine should be obtained at least 28 days after the first dose for students attending postsecondary schools, health care workers, or international travelers. People who received inactivated measles vaccine or an unknown type of measles vaccine during 1963-1967 should receive 2 doses of MMR vaccine. People who received inactivated mumps vaccine or an unknown type of mumps vaccine before 1979 and are at high risk for mumps infection should consider immunization with 2 doses of MMR vaccine. Unvaccinated health care workers born before [birth year] who lack laboratory evidence of measles, mumps, or rubella immunity or laboratory confirmation of disease should consider measles and mumps immunization with 2 doses of MMR vaccine or rubella immunization with 1 dose of MMR vaccine.  Pneumococcal 13-valent conjugate  (PCV13) vaccine. When indicated, a person who is uncertain of his immunization history and has no record of immunization should receive the PCV13 vaccine. All adults 75 years of age and older should receive this vaccine. An adult aged 84 years or older who has certain medical conditions and has not been previously immunized should receive 1 dose of PCV13 vaccine. This PCV13 should be followed with a dose of pneumococcal polysaccharide (PPSV23) vaccine. Adults who are at high risk for pneumococcal disease should obtain the PPSV23 vaccine at least 8 weeks after the dose of PCV13 vaccine. Adults older than 54 years of age who have normal immune system function should obtain the PPSV23 vaccine dose at least 1 year after the dose of PCV13 vaccine.  Pneumococcal polysaccharide (PPSV23) vaccine. When PCV13 is also indicated, PCV13 should be obtained first. All adults aged 29 years and older should be immunized. An adult younger than age 55 years who has certain medical conditions should be immunized. Any person who resides in a nursing home or long-term care facility should be immunized. An adult smoker should be immunized. People with an immunocompromised condition and certain other conditions should receive both PCV13 and PPSV23 vaccines. People with human immunodeficiency virus (HIV) infection should be immunized as soon as possible after diagnosis. Immunization during chemotherapy or radiation therapy should be avoided. Routine use of PPSV23 vaccine is not recommended for American Indians, [place] Natives, or people younger than 65 years unless there are medical conditions that require PPSV23 vaccine. When indicated, people who have unknown immunization and have no record of immunization should receive PPSV23 vaccine. One-time revaccination 5 years after the first dose of PPSV23 is recommended for people aged 19-64 years who have chronic kidney failure, nephrotic syndrome, asplenia, or immunocompromised conditions.  People who received 1-2 doses of PPSV23 before age 58 years should receive another dose of PPSV23 vaccine at age 28 years or later if at least 5 years have passed since the previous dose. Doses of PPSV23 are not needed for people immunized with PPSV23 at or after age 66 years.  Meningococcal vaccine. Adults with asplenia or persistent complement component deficiencies should receive 2 doses of quadrivalent meningococcal conjugate (MenACWY-D) vaccine. The doses should be obtained at least 2 months  apart. Microbiologists working with certain meningococcal bacteria, [employer] recruits, people at risk during an outbreak, and people who travel to or live in countries with a high rate of meningitis should be immunized. A first-year college student up through age 45 years who is living in a residence hall should receive a dose if he did not receive a dose on or after his 16th birthday. Adults who have certain high-risk conditions should receive one or more doses of vaccine.  Hepatitis A vaccine. Adults who wish to be protected from this disease, have chronic liver disease, work with hepatitis A-infected animals, work in hepatitis A research labs, or travel to or work in countries with a high rate of hepatitis A should be immunized. Adults who were previously unvaccinated and who anticipate close contact with an international adoptee during the first 60 days after arrival in the [place] from a country with a high rate of hepatitis A should be immunized.  Hepatitis B vaccine. Adults should be immunized if they wish to be protected from this disease, are under age 36 years and have diabetes, have chronic liver disease, have had more than one sex partner in the past 6 months, may be exposed to blood or other infectious body fluids, are household contacts or sex partners of hepatitis B positive people, are clients or workers in certain care facilities, or travel to or work in countries with a high rate of hepatitis  B.  Haemophilus influenzae type b (Hib) vaccine. A previously unvaccinated person with asplenia or sickle cell disease or having a scheduled splenectomy should receive 1 dose of Hib vaccine. Regardless of previous immunization, a recipient of a hematopoietic stem cell transplant should receive a 3-dose series 6-12 months after his successful transplant. Hib vaccine is not recommended for adults with HIV infection. Preventive Service / Frequency Ages 27 to 75  Blood pressure check.** / Every 3-5 years.  Lipid and cholesterol check.** / Every 5 years beginning at age 68.  Hepatitis C blood test.** / For any individual with known risks for hepatitis C.  Skin self-exam. / Monthly.  Influenza vaccine. / Every year.  Tetanus, diphtheria, and acellular pertussis (Tdap, Td) vaccine.** / Consult your health care provider. 1 dose of Td every 10 years.  Varicella vaccine.** / Consult your health care provider.  HPV vaccine. / 3 doses over 6 months, if 64 or younger.  Measles, mumps, rubella (MMR) vaccine.** / You need at least 1 dose of MMR if you were born in 1957 or later. You may also need a second dose.  Pneumococcal 13-valent conjugate (PCV13) vaccine.** / Consult your health care provider.  Pneumococcal polysaccharide (PPSV23) vaccine.** / 1 to 2 doses if you smoke cigarettes or if you have certain conditions.  Meningococcal vaccine.** / 1 dose if you are age 54 to 85 years and a [employer] researcher living in a residence hall, or have one of several medical conditions. You may also need additional booster doses.  Hepatitis A vaccine.** / Consult your health care provider.  Hepatitis B vaccine.** / Consult your health care provider.  Haemophilus influenzae type b (Hib) vaccine.** / Consult your health care provider. Ages 43 to 58  Blood pressure check.** / Every year.  Lipid and cholesterol check.** / Every 5 years beginning at age 89.  Lung cancer screening. / Every year if  you are aged 69-80 years and have a 30-pack-year history of smoking and currently smoke or have quit within the past 15 years. Yearly screening  is stopped once you have quit smoking for at least 15 years or develop a health problem that would prevent you from having lung cancer treatment.  Fecal occult blood test (FOBT) of stool. / Every year beginning at age 55 and continuing until age 66. You may not have to do this test if you get a colonoscopy every 10 years.  Flexible sigmoidoscopy** or colonoscopy.** / Every 5 years for a flexible sigmoidoscopy or every 10 years for a colonoscopy beginning at age 52 and continuing until age 19.  Hepatitis C blood test.** / For all people born from [birth year] through 1965 and any individual with known risks for hepatitis C.  Skin self-exam. / Monthly.  Influenza vaccine. / Every year.  Tetanus, diphtheria, and acellular pertussis (Tdap/Td) vaccine.** / Consult your health care provider. 1 dose of Td every 10 years.  Varicella vaccine.** / Consult your health care provider.  Zoster vaccine.** / 1 dose for adults aged 65 years or older.  Measles, mumps, rubella (MMR) vaccine.** / You need at least 1 dose of MMR if you were born in 1957 or later. You may also need a second dose.  Pneumococcal 13-valent conjugate (PCV13) vaccine.** / Consult your health care provider.  Pneumococcal polysaccharide (PPSV23) vaccine.** / 1 to 2 doses if you smoke cigarettes or if you have certain conditions.  Meningococcal vaccine.** / Consult your health care provider.  Hepatitis A vaccine.** / Consult your health care provider.  Hepatitis B vaccine.** / Consult your health care provider.  Haemophilus influenzae type b (Hib) vaccine.** / Consult your health care provider. Ages 70 and over  Blood pressure check.** / Every year.  Lipid and cholesterol check.**/ Every 5 years beginning at age 18.  Lung cancer screening. / Every year if you are aged 54-80 years and have a  30-pack-year history of smoking and currently smoke or have quit within the past 15 years. Yearly screening is stopped once you have quit smoking for at least 15 years or develop a health problem that would prevent you from having lung cancer treatment.  Fecal occult blood test (FOBT) of stool. / Every year beginning at age 10 and continuing until age 24. You may not have to do this test if you get a colonoscopy every 10 years.  Flexible sigmoidoscopy** or colonoscopy.** / Every 5 years for a flexible sigmoidoscopy or every 10 years for a colonoscopy beginning at age 49 and continuing until age 48.  Hepatitis C blood test.** / For all people born from [birth year] through 1967 and any individual with known risks for hepatitis C.  Abdominal aortic aneurysm (AAA) screening.** / A one-time screening for ages 26 to 87 years who are current or former smokers.  Skin self-exam. / Monthly.  Influenza vaccine. / Every year.  Tetanus, diphtheria, and acellular pertussis (Tdap/Td) vaccine.** / 1 dose of Td every 10 years.  Varicella vaccine.** / Consult your health care provider.  Zoster vaccine.** / 1 dose for adults aged 49 years or older.  Pneumococcal 13-valent conjugate (PCV13) vaccine.** / 1 dose for all adults aged 26 years and older.  Pneumococcal polysaccharide (PPSV23) vaccine.** / 1 dose for all adults aged 81 years and older.  Meningococcal vaccine.** / Consult your health care provider.  Hepatitis A vaccine.** / Consult your health care provider.  Hepatitis B vaccine.** / Consult your health care provider.  Haemophilus influenzae type b (Hib) vaccine.** / Consult your health care provider. **Family history and personal history of risk and conditions may change  your health care provider's recommendations.   This information is not intended to replace advice given to you by your health care provider. Make sure you discuss any questions you have with your health care provider.   Document  Released: 12/22/2001 Document Revised: 11/16/2014 Document Reviewed: 03/23/2011 Elsevier Interactive Patient Education [employer].
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
## 2018-06-11 LAB — COMPREHENSIVE METABOLIC PANEL
A/G RATIO: 2 (ref 1.2–2.2)
ALT: 32 IU/L (ref 0–44)
AST: 24 IU/L (ref 0–40)
Albumin: 4.9 g/dL (ref 3.5–5.5)
Alkaline Phosphatase: 92 IU/L (ref 39–117)
BILIRUBIN TOTAL: 0.5 mg/dL (ref 0.0–1.2)
BUN/Creatinine Ratio: 18 (ref 9–20)
BUN: 21 mg/dL (ref 6–24)
CALCIUM: 9.8 mg/dL (ref 8.7–10.2)
CHLORIDE: 97 mmol/L (ref 96–106)
CO2: 28 mmol/L (ref 20–29)
Creatinine, Ser: 1.16 mg/dL (ref 0.76–1.27)
GFR, EST AFRICAN AMERICAN: 82 mL/min/{1.73_m2} (ref >59)
GFR, EST NON AFRICAN AMERICAN: 71 mL/min/{1.73_m2} (ref >59)
GLOBULIN, TOTAL: 2.5 g/dL (ref 1.5–4.5)
Glucose: 111 mg/dL — ABNORMAL HIGH (ref 65–99)
POTASSIUM: 4.1 mmol/L (ref 3.5–5.2)
SODIUM: 142 mmol/L (ref 134–144)
TOTAL PROTEIN: 7.4 g/dL (ref 6.0–8.5)

## 2018-06-11 LAB — LIPID PANEL
CHOL/HDL RATIO: 5.6 ratio — AB (ref 0.0–5.0)
Cholesterol, Total: 184 mg/dL (ref 100–199)
HDL: 33 mg/dL — ABNORMAL LOW (ref >39)
LDL Calculated: 71 mg/dL (ref 0–99)
Triglycerides: 398 mg/dL — ABNORMAL HIGH (ref 0–149)
VLDL Cholesterol Cal: 80 mg/dL — ABNORMAL HIGH (ref 5–40)

## 2018-06-11 LAB — TSH: TSH: 4.41 u[IU]/mL (ref 0.450–4.500)

## 2018-06-11 LAB — T4, FREE: FREE T4: 1.19 ng/dL (ref 0.82–1.77)

## 2018-06-11 LAB — HEMOGLOBIN A1C
Est. average glucose Bld gHb Est-mCnc: 108 mg/dL
Hgb A1c MFr Bld: 5.4 % (ref 4.8–5.6)

## 2018-06-11 LAB — T3, FREE: T3 FREE: 4.1 pg/mL (ref 2.0–4.4)

## 2018-06-11 LAB — VITAMIN D 25 HYDROXY (VIT D DEFICIENCY, FRACTURES): Vit D, 25-Hydroxy: 56.9 ng/mL (ref 30.0–100.0)

## 2018-06-20 ENCOUNTER — Telehealth: Payer: Self-pay | Admitting: Family Medicine

## 2018-06-20 NOTE — Telephone Encounter (Signed)
Patient called and has two items to discuss:  He sees Dr. Lovena Neighbours at [facility] where they do a PSA on him every three months, he is do again for one in Oct. He would like to come here for that lab draw, so I have sch him for Oct 4th to have this lab drawn, need an order placed for this please.  Also, he was sent a MyChart message about his recent lab work and was advised about taking fish oil. He has tried all avenues of fish oil and every time he gets bad heartburn with it. He wants to know if there is anything else he can take in place of that. He currently takes flax seed oil if that helps. He would like to talk to nurse about additional options.  He can be reached at [phone].

## 2018-06-21 NOTE — Telephone Encounter (Signed)
Please advise if you want to order the PSA for the patient or if we need order form Dr. Lovena Neighbours.  Also please advise if there is anything other than the fish oil that the patient can try for cholesterol.  Patient was last seen on 06/10/18. MPulliam, CMA/RT(R)

## 2018-06-21 NOTE — Telephone Encounter (Signed)
Yes that is fine we just need a diagnosis code and the order from the physician and it is my understanding we can draw other physicians' labs as a courtesy for our patients.  He can try burp-less fish oil and/or krill oil if he would like.

## 2018-06-22 NOTE — Telephone Encounter (Signed)
Called patient and left message for him to call the office. MPulliam, CMA/RT(R)

## 2018-06-22 NOTE — Telephone Encounter (Signed)
Patient notified. MPulliam, CMA/RT(R)  

## 2018-08-12 ENCOUNTER — Other Ambulatory Visit (INDEPENDENT_AMBULATORY_CARE_PROVIDER_SITE_OTHER): Payer: BLUE CROSS/BLUE SHIELD

## 2018-08-12 DIAGNOSIS — R972 Elevated prostate specific antigen [PSA]: Secondary | ICD-10-CM

## 2018-08-12 LAB — PSA: PSA: 4

## 2018-08-13 LAB — PSA: Prostate Specific Ag, Serum: 4 ng/mL (ref 0.0–4.0)

## 2018-09-06 ENCOUNTER — Telehealth: Payer: Self-pay | Admitting: Family Medicine

## 2018-09-06 NOTE — Telephone Encounter (Signed)
Patient called states he got his PSA results a week ago & thought a referral to [facility] was being set up for him.  ---Forwarding message to medical assistant & Dorothea Ogle to review and contact pt with information.  -glh

## 2018-09-06 NOTE — Telephone Encounter (Signed)
Per Dorothea Ogle, this issue has already been addressed with the patient.  Charyl Bigger, CMA

## 2018-09-06 NOTE — Telephone Encounter (Signed)
LVM for pt to call to discuss.  T. Sharnice Bosler, CMA  

## 2018-11-11 DIAGNOSIS — R972 Elevated prostate specific antigen [PSA]: Secondary | ICD-10-CM | POA: Diagnosis not present

## 2018-11-11 DIAGNOSIS — R350 Frequency of micturition: Secondary | ICD-10-CM | POA: Diagnosis not present

## 2018-11-11 DIAGNOSIS — N401 Enlarged prostate with lower urinary tract symptoms: Secondary | ICD-10-CM | POA: Diagnosis not present

## 2018-11-11 LAB — PSA
PSA: 2.73
PSA: 2.73

## 2019-02-10 DIAGNOSIS — R972 Elevated prostate specific antigen [PSA]: Secondary | ICD-10-CM | POA: Diagnosis not present

## 2019-02-10 LAB — PSA: PSA: 3.21

## 2019-02-16 DIAGNOSIS — R3911 Hesitancy of micturition: Secondary | ICD-10-CM | POA: Diagnosis not present

## 2019-02-16 DIAGNOSIS — N401 Enlarged prostate with lower urinary tract symptoms: Secondary | ICD-10-CM | POA: Diagnosis not present

## 2019-02-16 DIAGNOSIS — R972 Elevated prostate specific antigen [PSA]: Secondary | ICD-10-CM | POA: Diagnosis not present

## 2019-02-16 DIAGNOSIS — Z8042 Family history of malignant neoplasm of prostate: Secondary | ICD-10-CM | POA: Diagnosis not present

## 2019-03-13 IMAGING — MR MR PROSTATE WO/W CM
56 series · 56 of 56 positions shown · IV contrast (multihance)
Comparison: None.

CLINICAL DATA: Elevated PSA, benign biopsy in 1945 and 0344

EXAM:
MR PROSTATE WITHOUT AND WITH CONTRAST
TECHNIQUE: Multiplanar multisequence MRI images were obtained of the pelvis
centered about the prostate. Pre and post contrast images were
obtained.
CONTRAST:  20mL MULTIHANCE GADOBENATE DIMEGLUMINE 529 MG/ML IV SOLN
Creatinine was obtained on site at [HOSPITAL] at [HOSPITAL].
Results: Creatinine 1.0 mg/dL.

[Series 3: T1 · axial · 8.0mm · 1.06mm/px · 1 of 32 slices shown (1 of 2)]
[im 1/32]
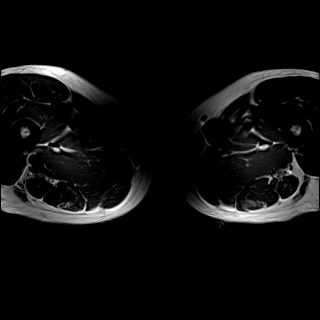

[Series 4: bSSFP fat-sat · axial · 8.0mm · 0.74mm/px · 1 of 32 slices shown]
[im 1/32]
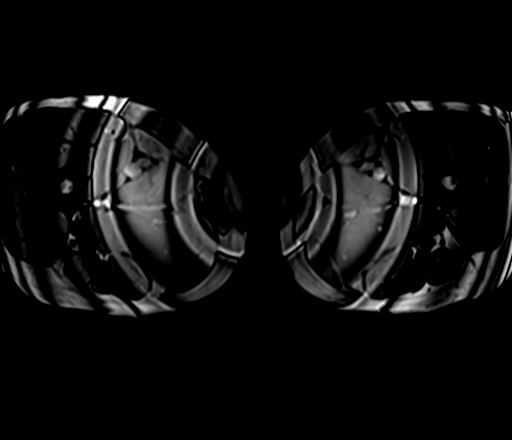

[Series 5: T2 · sagittal · 3.5mm · 0.59mm/px · 1 of 45 slices shown (1 of 4)]
[im 1/45]
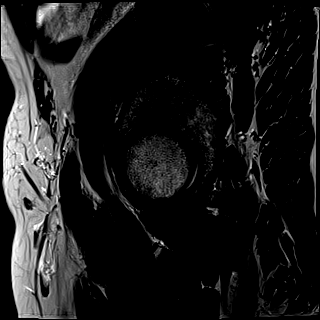

[Series 6: T1 · axial · 3.0mm · 0.33mm/px · 1 of 35 slices shown (2 of 2)]
[im 1/35]
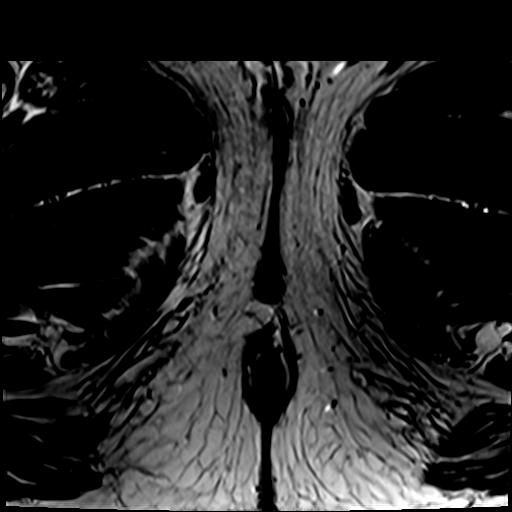

[Series 7: T2 · axial · 3.5mm · 0.56mm/px · 1 of 30 slices shown (2 of 4)]
[im 1/30]
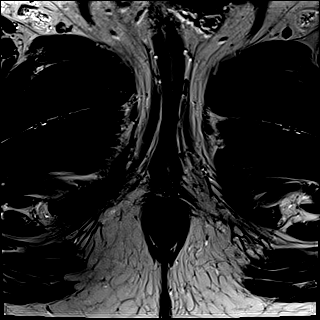

[Series 8: T2 · axial · 1.0mm · 1.04mm/px · 1 of 80 slices shown (3 of 4)]
[im 1/80]
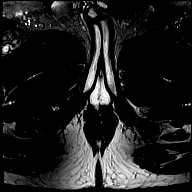

[Series 9: T2 · coronal · 3.5mm · 0.56mm/px · 1 of 35 slices shown (4 of 4)]
[im 1/35]
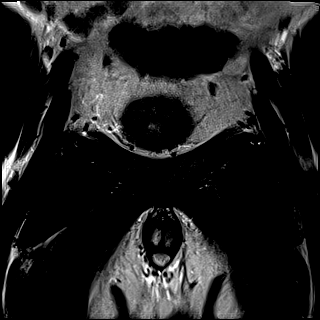

[Series 10: DWI · axial · 3.5mm · 1.56mm/px · 1 of 73 slices shown (1 of 2)]
[im 1/73]
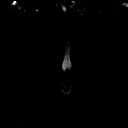

[Series 11: DWI · axial · 3.5mm · 1.56mm/px · 1 of 25 slices shown (2 of 2)]
[im 1/25]
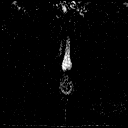

[Series 12: pre t1_twist_tra_dyn_ttc=7.5s · axial · non-contrast · 3.5mm · 0.89mm/px · 1 of 30 slices shown]
[im 1/30]
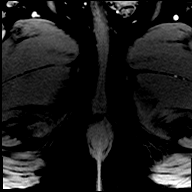

[Series 13: post t1_twist_tra_dyn-copy center · axial · 3.5mm · 0.89mm/px · 1 of 30 slices shown (1 of 24)]
[im 1/30]
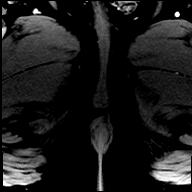

[Series 14: post t1_twist_tra_dyn-copy center · axial · 3.5mm · 0.89mm/px · 1 of 30 slices shown (2 of 24)]
[im 1/30]
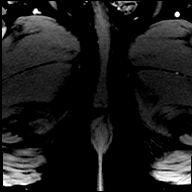

[Series 15: post t1_twist_tra_dyn-copy cent_sub_ttc=33.7s · axial · 3.5mm · 0.89mm/px · 1 of 29 slices shown]
[im 1/29]
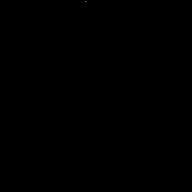

[Series 16: post t1_twist_tra_dyn-copy center · axial · 3.5mm · 0.89mm/px · 1 of 30 slices shown (3 of 24)]
[im 1/30]
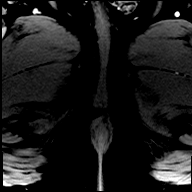

[Series 17: post t1_twist_tra_dyn-copy cent_sub_ttc=49.1s · axial · 3.5mm · 0.89mm/px · 1 of 30 slices shown]
[im 1/30]
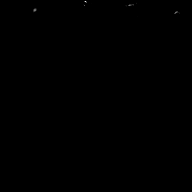

[Series 18: post t1_twist_tra_dyn-copy center · axial · 3.5mm · 0.89mm/px · 1 of 30 slices shown (4 of 24)]
[im 1/30]
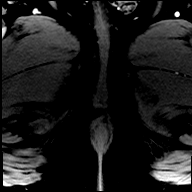

[Series 19: post t1_twist_tra_dyn-copy cent_sub_ttc=64.5s · axial · 3.5mm · 0.89mm/px · 1 of 30 slices shown]
[im 1/30]
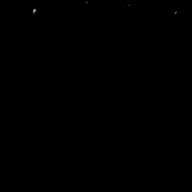

[Series 20: post t1_twist_tra_dyn-copy center · axial · 3.5mm · 0.89mm/px · 1 of 30 slices shown (5 of 24)]
[im 1/30]
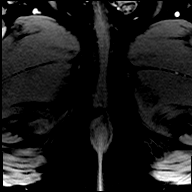

[Series 21: post t1_twist_tra_dyn-copy cent_sub_ttc=79.9s · axial · 3.5mm · 0.89mm/px · 1 of 30 slices shown]
[im 1/30]
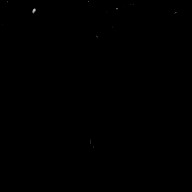

[Series 22: post t1_twist_tra_dyn-copy center · axial · 3.5mm · 0.89mm/px · 1 of 30 slices shown (6 of 24)]
[im 1/30]
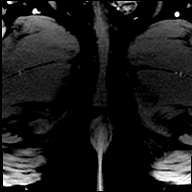

[Series 23: post t1_twist_tra_dyn-copy cent_sub_ttc=95.3s · axial · 3.5mm · 0.89mm/px · 1 of 30 slices shown]
[im 1/30]
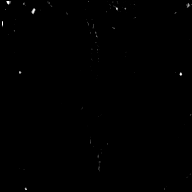

[Series 24: post t1_twist_tra_dyn-copy center · axial · 3.5mm · 0.89mm/px · 1 of 30 slices shown (7 of 24)]
[im 1/30]
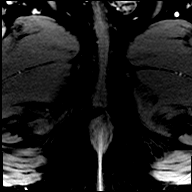

[Series 25: post t1_twist_tra_dyn-copy cent_sub_ttc=110.7s · axial · 3.5mm · 0.89mm/px · 1 of 30 slices shown]
[im 1/30]
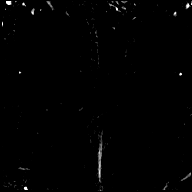

[Series 26: post t1_twist_tra_dyn-copy center · axial · 3.5mm · 0.89mm/px · 1 of 30 slices shown (8 of 24)]
[im 1/30]
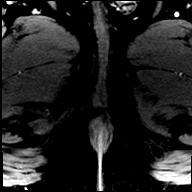

[Series 27: post t1_twist_tra_dyn-copy cent_sub_ttc=126.1s · axial · 3.5mm · 0.89mm/px · 1 of 30 slices shown]
[im 1/30]
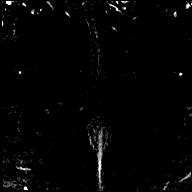

[Series 28: post t1_twist_tra_dyn-copy center · axial · 3.5mm · 0.89mm/px · 1 of 30 slices shown (9 of 24)]
[im 1/30]
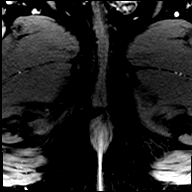

[Series 29: post t1_twist_tra_dyn-copy cent_sub_ttc=141.5s · axial · 3.5mm · 0.89mm/px · 1 of 30 slices shown]
[im 1/30]
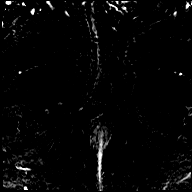

[Series 30: post t1_twist_tra_dyn-copy center · axial · 3.5mm · 0.89mm/px · 1 of 30 slices shown (10 of 24)]
[im 1/30]
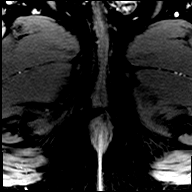

[Series 31: post t1_twist_tra_dyn-copy cent_sub_ttc=156.9s · axial · 3.5mm · 0.89mm/px · 1 of 30 slices shown]
[im 1/30]
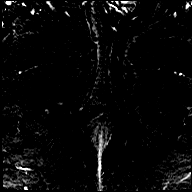

[Series 32: post t1_twist_tra_dyn-copy center · axial · 3.5mm · 0.89mm/px · 1 of 30 slices shown (11 of 24)]
[im 1/30]
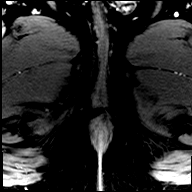

[Series 33: post t1_twist_tra_dyn-copy cent_sub_ttc=172.3s · axial · 3.5mm · 0.89mm/px · 1 of 30 slices shown]
[im 1/30]
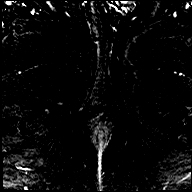

[Series 34: post t1_twist_tra_dyn-copy center · axial · 3.5mm · 0.89mm/px · 1 of 30 slices shown (12 of 24)]
[im 1/30]
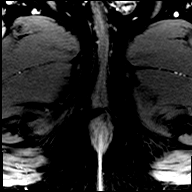

[Series 35: post t1_twist_tra_dyn-copy cent_sub_ttc=187.7s · axial · 3.5mm · 0.89mm/px · 1 of 30 slices shown]
[im 1/30]
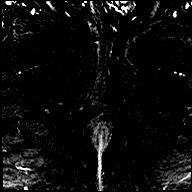

[Series 36: post t1_twist_tra_dyn-copy center · axial · 3.5mm · 0.89mm/px · 1 of 30 slices shown (13 of 24)]
[im 1/30]
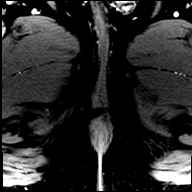

[Series 37: post t1_twist_tra_dyn-copy cent_sub_ttc=203.1s · axial · 3.5mm · 0.89mm/px · 1 of 30 slices shown]
[im 1/30]
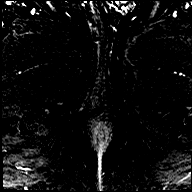

[Series 38: post t1_twist_tra_dyn-copy center · axial · 3.5mm · 0.89mm/px · 1 of 30 slices shown (14 of 24)]
[im 1/30]
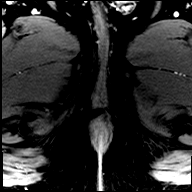

[Series 39: post t1_twist_tra_dyn-copy cent_sub_ttc=218.5s · axial · 3.5mm · 0.89mm/px · 1 of 30 slices shown]
[im 1/30]
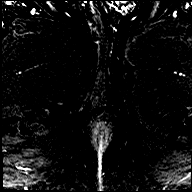

[Series 40: post t1_twist_tra_dyn-copy center · axial · 3.5mm · 0.89mm/px · 1 of 30 slices shown (15 of 24)]
[im 1/30]
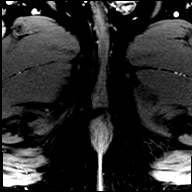

[Series 41: post t1_twist_tra_dyn-copy cent_sub_ttc=233.9s · axial · 3.5mm · 0.89mm/px · 1 of 30 slices shown]
[im 1/30]
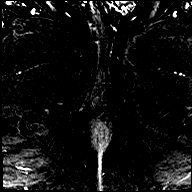

[Series 42: post t1_twist_tra_dyn-copy center · axial · 3.5mm · 0.89mm/px · 1 of 30 slices shown (16 of 24)]
[im 1/30]
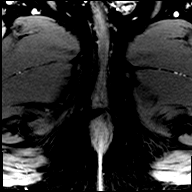

[Series 43: post t1_twist_tra_dyn-copy cent_sub_ttc=249.2s · axial · 3.5mm · 0.89mm/px · 1 of 30 slices shown]
[im 1/30]
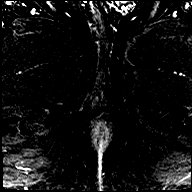

[Series 44: post t1_twist_tra_dyn-copy center · axial · 3.5mm · 0.89mm/px · 1 of 30 slices shown (17 of 24)]
[im 1/30]
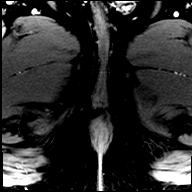

[Series 45: post t1_twist_tra_dyn-copy cent_sub_ttc=264.6s · axial · 3.5mm · 0.89mm/px · 1 of 30 slices shown]
[im 1/30]
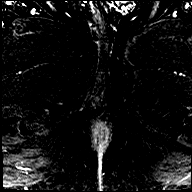

[Series 46: post t1_twist_tra_dyn-copy center · axial · 3.5mm · 0.89mm/px · 1 of 30 slices shown (18 of 24)]
[im 1/30]
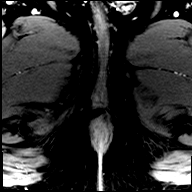

[Series 47: post t1_twist_tra_dyn-copy cent_sub_ttc=280.0s · axial · 3.5mm · 0.89mm/px · 1 of 30 slices shown]
[im 1/30]
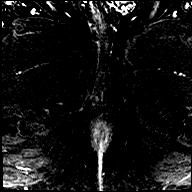

[Series 48: post t1_twist_tra_dyn-copy center · axial · 3.5mm · 0.89mm/px · 1 of 30 slices shown (19 of 24)]
[im 1/30]
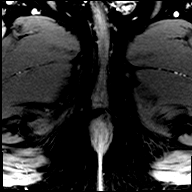

[Series 49: post t1_twist_tra_dyn-copy cent_sub_ttc=295.4s · axial · 3.5mm · 0.89mm/px · 1 of 30 slices shown]
[im 1/30]
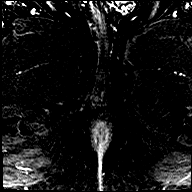

[Series 50: post t1_twist_tra_dyn-copy center · axial · 3.5mm · 0.89mm/px · 1 of 30 slices shown (20 of 24)]
[im 1/30]
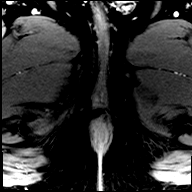

[Series 51: post t1_twist_tra_dyn-copy cent_sub_ttc=310.8s · axial · 3.5mm · 0.89mm/px · 1 of 30 slices shown]
[im 1/30]
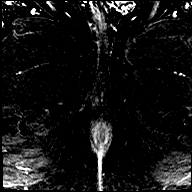

[Series 52: post t1_twist_tra_dyn-copy center · axial · 3.5mm · 0.89mm/px · 1 of 30 slices shown (21 of 24)]
[im 1/30]
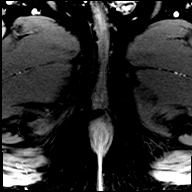

[Series 53: post t1_twist_tra_dyn-copy cent_sub_ttc=326.2s · axial · 3.5mm · 0.89mm/px · 1 of 30 slices shown]
[im 1/30]
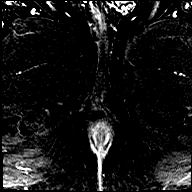

[Series 54: post t1_twist_tra_dyn-copy center · axial · 3.5mm · 0.89mm/px · 1 of 30 slices shown (22 of 24)]
[im 1/30]
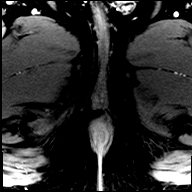

[Series 55: post t1_twist_tra_dyn-copy cent_sub_ttc=341.6s · axial · 3.5mm · 0.89mm/px · 1 of 30 slices shown]
[im 1/30]
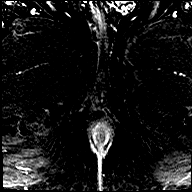

[Series 56: post t1_twist_tra_dyn-copy center · axial · 3.5mm · 0.89mm/px · 1 of 30 slices shown (23 of 24)]
[im 1/30]
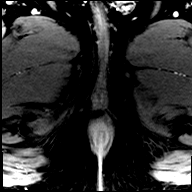

[Series 57: post t1_twist_tra_dyn-copy cent_sub_ttc=357.0s · axial · 3.5mm · 0.89mm/px · 1 of 30 slices shown]
[im 1/30]
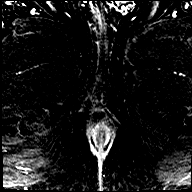

[Series 58: post t1_twist_tra_dyn-copy center · axial · 3.5mm · 0.89mm/px · 1 of 30 slices shown (24 of 24)]
[im 1/30]
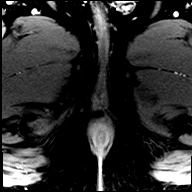

[56 of 56 positions shown; findings below may reference images not displayed]

FINDINGS: Prostate: 1.6 x 0.7 x 2.2 cm focal low T2 lesion in the left
posterolateral mid peripheral gland (series 7/image 13). Associated
early arterial enhancement (series 24/image 15). Associated low
ADC/restricted diffusion (series 11/image 22). This appearance is
worrisome for high-grade macroscopic prostate cancer (PI-RADS 4).

Volume: 5.0 x 3.8 x 3.8 cm (calculated volume 37.3 mL).

Transcapsular spread:  Absent.

Seminal vesicle involvement: Absent.

Neurovascular bundle involvement: Absent.

Pelvic adenopathy: Absent.

Bone metastasis: Absent.

Other findings: None.
IMPRESSION: 1.6 x 0.7 x 2.2 cm lesion in the left posterolateral mid peripheral
gland, worrisome for high-grade macroscopic prostate cancer (PI-RADS
4).

No evidence of seminal vesicle invasion, extracapsular extension, or
metastatic disease.

## 2019-04-06 DIAGNOSIS — H5212 Myopia, left eye: Secondary | ICD-10-CM | POA: Diagnosis not present

## 2019-04-06 DIAGNOSIS — H524 Presbyopia: Secondary | ICD-10-CM | POA: Diagnosis not present

## 2019-05-17 DIAGNOSIS — R972 Elevated prostate specific antigen [PSA]: Secondary | ICD-10-CM | POA: Diagnosis not present

## 2019-06-02 ENCOUNTER — Other Ambulatory Visit: Payer: Self-pay | Admitting: Family Medicine

## 2019-06-02 DIAGNOSIS — I10 Essential (primary) hypertension: Secondary | ICD-10-CM

## 2019-07-10 ENCOUNTER — Other Ambulatory Visit: Payer: Self-pay | Admitting: Family Medicine

## 2019-07-10 DIAGNOSIS — E559 Vitamin D deficiency, unspecified: Secondary | ICD-10-CM

## 2019-07-20 ENCOUNTER — Telehealth: Payer: Self-pay | Admitting: Family Medicine

## 2019-07-20 DIAGNOSIS — I10 Essential (primary) hypertension: Secondary | ICD-10-CM

## 2019-07-20 DIAGNOSIS — E559 Vitamin D deficiency, unspecified: Secondary | ICD-10-CM

## 2019-07-20 MED ORDER — LOSARTAN POTASSIUM-HCTZ 100-12.5 MG PO TABS: 1.0000 | ORAL_TABLET | Freq: Every day | ORAL | 0 refills | Status: DC

## 2019-07-20 MED ORDER — VITAMIN D (ERGOCALCIFEROL) 1.25 MG (50000 UNIT) PO CAPS: ORAL_CAPSULE | ORAL | 0 refills | Status: DC

## 2019-07-20 NOTE — Telephone Encounter (Signed)
Please call pt to schedule f/u via telemedicine.  Pt will also need lab appt.  No further refills until pt is seen.  Charyl Bigger, CMA

## 2019-07-20 NOTE — Addendum Note (Signed)
Addended by: Fonnie Mu on: 07/20/2019 10:45 AM   Modules accepted: Orders

## 2019-07-20 NOTE — Telephone Encounter (Signed)
Patient is requesting a refill of his losartan and vitamin D. If approved please send to [facility] in [place].

## 2019-08-12 ENCOUNTER — Other Ambulatory Visit: Payer: Self-pay | Admitting: Family Medicine

## 2019-08-12 DIAGNOSIS — E559 Vitamin D deficiency, unspecified: Secondary | ICD-10-CM

## 2019-08-12 DIAGNOSIS — I10 Essential (primary) hypertension: Secondary | ICD-10-CM

## 2019-08-13 NOTE — Telephone Encounter (Signed)
Call pt and notify him of need for appt and bldwrk

## 2019-09-04 ENCOUNTER — Other Ambulatory Visit: Payer: Self-pay | Admitting: Family Medicine

## 2019-09-04 DIAGNOSIS — E559 Vitamin D deficiency, unspecified: Secondary | ICD-10-CM

## 2019-09-06 ENCOUNTER — Other Ambulatory Visit: Payer: Self-pay | Admitting: Family Medicine

## 2019-09-06 DIAGNOSIS — I10 Essential (primary) hypertension: Secondary | ICD-10-CM

## 2019-09-07 ENCOUNTER — Other Ambulatory Visit: Payer: BC Managed Care – PPO

## 2019-09-07 ENCOUNTER — Other Ambulatory Visit: Payer: Self-pay

## 2019-09-07 ENCOUNTER — Encounter: Payer: BC Managed Care – PPO | Admitting: Family Medicine

## 2019-09-07 DIAGNOSIS — R7989 Other specified abnormal findings of blood chemistry: Secondary | ICD-10-CM

## 2019-09-07 DIAGNOSIS — E781 Pure hyperglyceridemia: Secondary | ICD-10-CM | POA: Diagnosis not present

## 2019-09-07 DIAGNOSIS — I10 Essential (primary) hypertension: Secondary | ICD-10-CM | POA: Diagnosis not present

## 2019-09-07 DIAGNOSIS — E559 Vitamin D deficiency, unspecified: Secondary | ICD-10-CM

## 2019-09-07 DIAGNOSIS — E785 Hyperlipidemia, unspecified: Secondary | ICD-10-CM

## 2019-09-07 DIAGNOSIS — R972 Elevated prostate specific antigen [PSA]: Secondary | ICD-10-CM

## 2019-09-08 LAB — CBC WITH DIFFERENTIAL/PLATELET
Basophils Absolute: 0.1 10*3/uL (ref 0.0–0.2)
Basos: 1 %
EOS (ABSOLUTE): 0.2 10*3/uL (ref 0.0–0.4)
Eos: 4 %
Hematocrit: 44.4 % (ref 37.5–51.0)
Hemoglobin: 14.9 g/dL (ref 13.0–17.7)
Immature Grans (Abs): 0 10*3/uL (ref 0.0–0.1)
Immature Granulocytes: 1 %
Lymphocytes Absolute: 1.8 10*3/uL (ref 0.7–3.1)
Lymphs: 28 %
MCH: 26.9 pg (ref 26.6–33.0)
MCHC: 33.6 g/dL (ref 31.5–35.7)
MCV: 80 fL (ref 79–97)
Monocytes Absolute: 0.3 10*3/uL (ref 0.1–0.9)
Monocytes: 6 %
Neutrophils Absolute: 3.8 10*3/uL (ref 1.4–7.0)
Neutrophils: 60 %
Platelets: 251 10*3/uL (ref 150–450)
RBC: 5.53 x10E6/uL (ref 4.14–5.80)
RDW: 13.1 % (ref 11.6–15.4)
WBC: 6.2 10*3/uL (ref 3.4–10.8)

## 2019-09-08 LAB — COMPREHENSIVE METABOLIC PANEL
ALT: 24 IU/L (ref 0–44)
AST: 21 IU/L (ref 0–40)
Albumin/Globulin Ratio: 2 (ref 1.2–2.2)
Albumin: 4.7 g/dL (ref 3.8–4.9)
Alkaline Phosphatase: 104 IU/L (ref 39–117)
BUN/Creatinine Ratio: 19 (ref 9–20)
BUN: 21 mg/dL (ref 6–24)
Bilirubin Total: 0.3 mg/dL (ref 0.0–1.2)
CO2: 23 mmol/L (ref 20–29)
Calcium: 9.6 mg/dL (ref 8.7–10.2)
Chloride: 102 mmol/L (ref 96–106)
Creatinine, Ser: 1.13 mg/dL (ref 0.76–1.27)
GFR calc Af Amer: 84 mL/min/{1.73_m2} (ref >59)
GFR calc non Af Amer: 73 mL/min/{1.73_m2} (ref >59)
Globulin, Total: 2.3 g/dL (ref 1.5–4.5)
Glucose: 106 mg/dL — ABNORMAL HIGH (ref 65–99)
Potassium: 4.1 mmol/L (ref 3.5–5.2)
Sodium: 141 mmol/L (ref 134–144)
Total Protein: 7 g/dL (ref 6.0–8.5)

## 2019-09-08 LAB — TSH: TSH: 4.14 u[IU]/mL (ref 0.450–4.500)

## 2019-09-08 LAB — VITAMIN D 25 HYDROXY (VIT D DEFICIENCY, FRACTURES): Vit D, 25-Hydroxy: 52.5 ng/mL (ref 30.0–100.0)

## 2019-09-08 LAB — LIPID PANEL
Chol/HDL Ratio: 6.6 ratio — ABNORMAL HIGH (ref 0.0–5.0)
Cholesterol, Total: 177 mg/dL (ref 100–199)
HDL: 27 mg/dL — ABNORMAL LOW (ref >39)
LDL Chol Calc (NIH): 83 mg/dL (ref 0–99)
Triglycerides: 413 mg/dL — ABNORMAL HIGH (ref 0–149)
VLDL Cholesterol Cal: 67 mg/dL — ABNORMAL HIGH (ref 5–40)

## 2019-09-08 LAB — HEMOGLOBIN A1C
Est. average glucose Bld gHb Est-mCnc: 105 mg/dL
Hgb A1c MFr Bld: 5.3 % (ref 4.8–5.6)

## 2019-09-08 LAB — T3: T3, Total: 135 ng/dL (ref 71–180)

## 2019-09-08 LAB — PSA: Prostate Specific Ag, Serum: 4.7 ng/mL — ABNORMAL HIGH (ref 0.0–4.0)

## 2019-09-08 LAB — T4, FREE: Free T4: 1.14 ng/dL (ref 0.82–1.77)

## 2019-09-14 ENCOUNTER — Other Ambulatory Visit: Payer: Self-pay | Admitting: Family Medicine

## 2019-09-14 DIAGNOSIS — I10 Essential (primary) hypertension: Secondary | ICD-10-CM

## 2019-09-14 DIAGNOSIS — R972 Elevated prostate specific antigen [PSA]: Secondary | ICD-10-CM | POA: Diagnosis not present

## 2019-10-18 ENCOUNTER — Other Ambulatory Visit: Payer: Self-pay | Admitting: Family Medicine

## 2019-10-18 DIAGNOSIS — I10 Essential (primary) hypertension: Secondary | ICD-10-CM

## 2019-10-20 ENCOUNTER — Encounter: Payer: BC Managed Care – PPO | Admitting: Family Medicine

## 2019-10-26 ENCOUNTER — Encounter: Payer: BC Managed Care – PPO | Admitting: Family Medicine

## 2019-10-26 DIAGNOSIS — Z0000 Encounter for general adult medical examination without abnormal findings: Secondary | ICD-10-CM | POA: Diagnosis not present

## 2019-10-26 DIAGNOSIS — Z6829 Body mass index (BMI) 29.0-29.9, adult: Secondary | ICD-10-CM | POA: Diagnosis not present

## 2019-10-26 DIAGNOSIS — E782 Mixed hyperlipidemia: Secondary | ICD-10-CM | POA: Diagnosis not present

## 2019-10-26 DIAGNOSIS — Z1331 Encounter for screening for depression: Secondary | ICD-10-CM | POA: Diagnosis not present

## 2019-10-26 DIAGNOSIS — E663 Overweight: Secondary | ICD-10-CM | POA: Diagnosis not present

## 2019-10-26 DIAGNOSIS — R972 Elevated prostate specific antigen [PSA]: Secondary | ICD-10-CM | POA: Diagnosis not present

## 2019-11-15 ENCOUNTER — Other Ambulatory Visit: Payer: Self-pay | Admitting: Family Medicine

## 2019-11-15 DIAGNOSIS — I10 Essential (primary) hypertension: Secondary | ICD-10-CM

## 2019-12-21 DIAGNOSIS — N401 Enlarged prostate with lower urinary tract symptoms: Secondary | ICD-10-CM | POA: Diagnosis not present

## 2019-12-21 DIAGNOSIS — R350 Frequency of micturition: Secondary | ICD-10-CM | POA: Diagnosis not present

## 2019-12-29 ENCOUNTER — Other Ambulatory Visit: Payer: Self-pay | Admitting: Family Medicine

## 2019-12-29 DIAGNOSIS — I10 Essential (primary) hypertension: Secondary | ICD-10-CM

## 2020-02-02 DIAGNOSIS — E785 Hyperlipidemia, unspecified: Secondary | ICD-10-CM | POA: Diagnosis not present

## 2020-02-02 DIAGNOSIS — Z79899 Other long term (current) drug therapy: Secondary | ICD-10-CM | POA: Diagnosis not present

## 2020-05-07 DIAGNOSIS — E782 Mixed hyperlipidemia: Secondary | ICD-10-CM | POA: Diagnosis not present

## 2020-05-07 DIAGNOSIS — Z79899 Other long term (current) drug therapy: Secondary | ICD-10-CM | POA: Diagnosis not present

## 2020-06-07 DIAGNOSIS — M5136 Other intervertebral disc degeneration, lumbar region: Secondary | ICD-10-CM | POA: Diagnosis not present

## 2020-06-07 DIAGNOSIS — M5416 Radiculopathy, lumbar region: Secondary | ICD-10-CM | POA: Diagnosis not present

## 2020-06-10 DIAGNOSIS — M5416 Radiculopathy, lumbar region: Secondary | ICD-10-CM | POA: Diagnosis not present

## 2020-06-14 DIAGNOSIS — M5416 Radiculopathy, lumbar region: Secondary | ICD-10-CM | POA: Diagnosis not present

## 2020-06-17 DIAGNOSIS — M5416 Radiculopathy, lumbar region: Secondary | ICD-10-CM | POA: Diagnosis not present

## 2020-06-21 DIAGNOSIS — M5416 Radiculopathy, lumbar region: Secondary | ICD-10-CM | POA: Diagnosis not present

## 2020-06-21 DIAGNOSIS — M545 Low back pain: Secondary | ICD-10-CM | POA: Diagnosis not present

## 2020-07-01 DIAGNOSIS — M5416 Radiculopathy, lumbar region: Secondary | ICD-10-CM | POA: Diagnosis not present

## 2020-07-01 DIAGNOSIS — M21371 Foot drop, right foot: Secondary | ICD-10-CM | POA: Diagnosis not present

## 2020-07-01 DIAGNOSIS — N400 Enlarged prostate without lower urinary tract symptoms: Secondary | ICD-10-CM | POA: Diagnosis not present

## 2020-07-02 DIAGNOSIS — M5416 Radiculopathy, lumbar region: Secondary | ICD-10-CM | POA: Diagnosis not present

## 2020-07-02 DIAGNOSIS — Z6829 Body mass index (BMI) 29.0-29.9, adult: Secondary | ICD-10-CM | POA: Diagnosis not present

## 2020-07-02 DIAGNOSIS — Z01818 Encounter for other preprocedural examination: Secondary | ICD-10-CM | POA: Diagnosis not present

## 2020-07-08 DIAGNOSIS — N400 Enlarged prostate without lower urinary tract symptoms: Secondary | ICD-10-CM | POA: Diagnosis not present

## 2020-07-08 DIAGNOSIS — R972 Elevated prostate specific antigen [PSA]: Secondary | ICD-10-CM | POA: Diagnosis not present

## 2020-07-08 DIAGNOSIS — M5416 Radiculopathy, lumbar region: Secondary | ICD-10-CM | POA: Diagnosis not present

## 2020-07-12 DIAGNOSIS — M5127 Other intervertebral disc displacement, lumbosacral region: Secondary | ICD-10-CM | POA: Diagnosis not present

## 2020-08-22 DIAGNOSIS — M5416 Radiculopathy, lumbar region: Secondary | ICD-10-CM | POA: Diagnosis not present

## 2020-08-28 DIAGNOSIS — M5416 Radiculopathy, lumbar region: Secondary | ICD-10-CM | POA: Diagnosis not present

## 2020-08-30 DIAGNOSIS — M5416 Radiculopathy, lumbar region: Secondary | ICD-10-CM | POA: Diagnosis not present

## 2020-09-03 DIAGNOSIS — M5416 Radiculopathy, lumbar region: Secondary | ICD-10-CM | POA: Diagnosis not present

## 2020-09-05 DIAGNOSIS — M5416 Radiculopathy, lumbar region: Secondary | ICD-10-CM | POA: Diagnosis not present

## 2020-09-10 DIAGNOSIS — M5416 Radiculopathy, lumbar region: Secondary | ICD-10-CM | POA: Diagnosis not present

## 2020-09-12 DIAGNOSIS — M5416 Radiculopathy, lumbar region: Secondary | ICD-10-CM | POA: Diagnosis not present

## 2020-09-17 DIAGNOSIS — M5416 Radiculopathy, lumbar region: Secondary | ICD-10-CM | POA: Diagnosis not present

## 2020-09-19 DIAGNOSIS — M5416 Radiculopathy, lumbar region: Secondary | ICD-10-CM | POA: Diagnosis not present

## 2020-09-24 DIAGNOSIS — M5416 Radiculopathy, lumbar region: Secondary | ICD-10-CM | POA: Diagnosis not present

## 2020-09-27 DIAGNOSIS — M5416 Radiculopathy, lumbar region: Secondary | ICD-10-CM | POA: Diagnosis not present

## 2020-10-01 DIAGNOSIS — M5416 Radiculopathy, lumbar region: Secondary | ICD-10-CM | POA: Diagnosis not present

## 2020-10-08 DIAGNOSIS — N400 Enlarged prostate without lower urinary tract symptoms: Secondary | ICD-10-CM | POA: Diagnosis not present

## 2020-10-10 DIAGNOSIS — L578 Other skin changes due to chronic exposure to nonionizing radiation: Secondary | ICD-10-CM | POA: Diagnosis not present

## 2020-10-10 DIAGNOSIS — D225 Melanocytic nevi of trunk: Secondary | ICD-10-CM | POA: Diagnosis not present

## 2020-10-10 DIAGNOSIS — D2239 Melanocytic nevi of other parts of face: Secondary | ICD-10-CM | POA: Diagnosis not present

## 2020-10-10 DIAGNOSIS — D485 Neoplasm of uncertain behavior of skin: Secondary | ICD-10-CM | POA: Diagnosis not present

## 2020-10-10 DIAGNOSIS — D1801 Hemangioma of skin and subcutaneous tissue: Secondary | ICD-10-CM | POA: Diagnosis not present

## 2020-10-14 DIAGNOSIS — M5416 Radiculopathy, lumbar region: Secondary | ICD-10-CM | POA: Diagnosis not present

## 2020-10-17 DIAGNOSIS — M5416 Radiculopathy, lumbar region: Secondary | ICD-10-CM | POA: Diagnosis not present

## 2020-10-23 DIAGNOSIS — M5416 Radiculopathy, lumbar region: Secondary | ICD-10-CM | POA: Diagnosis not present

## 2020-10-24 DIAGNOSIS — M5416 Radiculopathy, lumbar region: Secondary | ICD-10-CM | POA: Diagnosis not present

## 2020-10-30 DIAGNOSIS — M5416 Radiculopathy, lumbar region: Secondary | ICD-10-CM | POA: Diagnosis not present

## 2020-11-05 DIAGNOSIS — M5416 Radiculopathy, lumbar region: Secondary | ICD-10-CM | POA: Diagnosis not present

## 2020-11-06 DIAGNOSIS — E782 Mixed hyperlipidemia: Secondary | ICD-10-CM | POA: Diagnosis not present

## 2020-11-06 DIAGNOSIS — M5416 Radiculopathy, lumbar region: Secondary | ICD-10-CM | POA: Diagnosis not present

## 2020-11-06 DIAGNOSIS — Z0000 Encounter for general adult medical examination without abnormal findings: Secondary | ICD-10-CM | POA: Diagnosis not present

## 2020-11-06 DIAGNOSIS — Z1331 Encounter for screening for depression: Secondary | ICD-10-CM | POA: Diagnosis not present

## 2020-11-06 DIAGNOSIS — E559 Vitamin D deficiency, unspecified: Secondary | ICD-10-CM | POA: Diagnosis not present

## 2021-02-11 DIAGNOSIS — N400 Enlarged prostate without lower urinary tract symptoms: Secondary | ICD-10-CM | POA: Diagnosis not present

## 2021-07-04 DIAGNOSIS — N400 Enlarged prostate without lower urinary tract symptoms: Secondary | ICD-10-CM | POA: Diagnosis not present

## 2021-07-11 DIAGNOSIS — R972 Elevated prostate specific antigen [PSA]: Secondary | ICD-10-CM | POA: Diagnosis not present

## 2021-07-11 DIAGNOSIS — R3912 Poor urinary stream: Secondary | ICD-10-CM | POA: Diagnosis not present

## 2021-07-11 DIAGNOSIS — N401 Enlarged prostate with lower urinary tract symptoms: Secondary | ICD-10-CM | POA: Diagnosis not present

## 2021-07-23 DIAGNOSIS — H5212 Myopia, left eye: Secondary | ICD-10-CM | POA: Diagnosis not present

## 2021-10-08 DIAGNOSIS — M5412 Radiculopathy, cervical region: Secondary | ICD-10-CM | POA: Diagnosis not present

## 2021-10-09 DIAGNOSIS — L578 Other skin changes due to chronic exposure to nonionizing radiation: Secondary | ICD-10-CM | POA: Diagnosis not present

## 2021-10-09 DIAGNOSIS — L820 Inflamed seborrheic keratosis: Secondary | ICD-10-CM | POA: Diagnosis not present

## 2021-10-09 DIAGNOSIS — D485 Neoplasm of uncertain behavior of skin: Secondary | ICD-10-CM | POA: Diagnosis not present

## 2021-10-30 DIAGNOSIS — M5412 Radiculopathy, cervical region: Secondary | ICD-10-CM | POA: Diagnosis not present

## 2021-11-19 DIAGNOSIS — R3912 Poor urinary stream: Secondary | ICD-10-CM | POA: Diagnosis not present

## 2021-11-19 DIAGNOSIS — N401 Enlarged prostate with lower urinary tract symptoms: Secondary | ICD-10-CM | POA: Diagnosis not present

## 2021-12-04 DIAGNOSIS — M5416 Radiculopathy, lumbar region: Secondary | ICD-10-CM | POA: Diagnosis not present

## 2021-12-30 DIAGNOSIS — D225 Melanocytic nevi of trunk: Secondary | ICD-10-CM | POA: Diagnosis not present

## 2021-12-30 DIAGNOSIS — L820 Inflamed seborrheic keratosis: Secondary | ICD-10-CM | POA: Diagnosis not present

## 2022-03-04 DIAGNOSIS — R7989 Other specified abnormal findings of blood chemistry: Secondary | ICD-10-CM | POA: Diagnosis not present

## 2022-03-04 DIAGNOSIS — Z125 Encounter for screening for malignant neoplasm of prostate: Secondary | ICD-10-CM | POA: Diagnosis not present

## 2022-03-04 DIAGNOSIS — E559 Vitamin D deficiency, unspecified: Secondary | ICD-10-CM | POA: Diagnosis not present

## 2022-03-04 DIAGNOSIS — Z0000 Encounter for general adult medical examination without abnormal findings: Secondary | ICD-10-CM | POA: Diagnosis not present

## 2022-03-09 DIAGNOSIS — R7303 Prediabetes: Secondary | ICD-10-CM | POA: Diagnosis not present

## 2022-03-09 DIAGNOSIS — I10 Essential (primary) hypertension: Secondary | ICD-10-CM | POA: Diagnosis not present

## 2022-03-09 DIAGNOSIS — E782 Mixed hyperlipidemia: Secondary | ICD-10-CM | POA: Diagnosis not present

## 2022-03-09 DIAGNOSIS — Z1331 Encounter for screening for depression: Secondary | ICD-10-CM | POA: Diagnosis not present

## 2022-03-09 DIAGNOSIS — Z0000 Encounter for general adult medical examination without abnormal findings: Secondary | ICD-10-CM | POA: Diagnosis not present

## 2022-03-12 DIAGNOSIS — N401 Enlarged prostate with lower urinary tract symptoms: Secondary | ICD-10-CM | POA: Diagnosis not present

## 2022-03-12 DIAGNOSIS — R3912 Poor urinary stream: Secondary | ICD-10-CM | POA: Diagnosis not present

## 2022-03-19 DIAGNOSIS — G4733 Obstructive sleep apnea (adult) (pediatric): Secondary | ICD-10-CM | POA: Diagnosis not present

## 2022-03-19 DIAGNOSIS — R0602 Shortness of breath: Secondary | ICD-10-CM | POA: Diagnosis not present

## 2022-03-20 DIAGNOSIS — G4733 Obstructive sleep apnea (adult) (pediatric): Secondary | ICD-10-CM | POA: Diagnosis not present

## 2022-03-20 DIAGNOSIS — R0602 Shortness of breath: Secondary | ICD-10-CM | POA: Diagnosis not present

## 2022-06-30 DIAGNOSIS — D485 Neoplasm of uncertain behavior of skin: Secondary | ICD-10-CM | POA: Diagnosis not present

## 2022-06-30 DIAGNOSIS — L578 Other skin changes due to chronic exposure to nonionizing radiation: Secondary | ICD-10-CM | POA: Diagnosis not present

## 2022-06-30 DIAGNOSIS — B079 Viral wart, unspecified: Secondary | ICD-10-CM | POA: Diagnosis not present

## 2022-06-30 DIAGNOSIS — L821 Other seborrheic keratosis: Secondary | ICD-10-CM | POA: Diagnosis not present

## 2022-06-30 DIAGNOSIS — D225 Melanocytic nevi of trunk: Secondary | ICD-10-CM | POA: Diagnosis not present

## 2022-07-14 DIAGNOSIS — R3912 Poor urinary stream: Secondary | ICD-10-CM | POA: Diagnosis not present

## 2022-07-14 DIAGNOSIS — N401 Enlarged prostate with lower urinary tract symptoms: Secondary | ICD-10-CM | POA: Diagnosis not present

## 2022-07-20 DIAGNOSIS — R972 Elevated prostate specific antigen [PSA]: Secondary | ICD-10-CM | POA: Diagnosis not present

## 2022-07-20 DIAGNOSIS — R3912 Poor urinary stream: Secondary | ICD-10-CM | POA: Diagnosis not present

## 2022-07-20 DIAGNOSIS — N401 Enlarged prostate with lower urinary tract symptoms: Secondary | ICD-10-CM | POA: Diagnosis not present

## 2022-08-07 DIAGNOSIS — D23122 Other benign neoplasm of skin of left lower eyelid, including canthus: Secondary | ICD-10-CM | POA: Diagnosis not present

## 2022-09-28 DIAGNOSIS — D23121 Other benign neoplasm of skin of left upper eyelid, including canthus: Secondary | ICD-10-CM | POA: Diagnosis not present

## 2023-01-25 DIAGNOSIS — N401 Enlarged prostate with lower urinary tract symptoms: Secondary | ICD-10-CM | POA: Diagnosis not present

## 2023-01-25 DIAGNOSIS — R3912 Poor urinary stream: Secondary | ICD-10-CM | POA: Diagnosis not present

## 2023-03-16 DIAGNOSIS — R7303 Prediabetes: Secondary | ICD-10-CM | POA: Diagnosis not present

## 2023-03-16 DIAGNOSIS — I10 Essential (primary) hypertension: Secondary | ICD-10-CM | POA: Diagnosis not present

## 2023-03-16 DIAGNOSIS — E559 Vitamin D deficiency, unspecified: Secondary | ICD-10-CM | POA: Diagnosis not present

## 2023-03-16 DIAGNOSIS — R7989 Other specified abnormal findings of blood chemistry: Secondary | ICD-10-CM | POA: Diagnosis not present

## 2023-03-16 DIAGNOSIS — E782 Mixed hyperlipidemia: Secondary | ICD-10-CM | POA: Diagnosis not present

## 2023-03-16 DIAGNOSIS — Z1331 Encounter for screening for depression: Secondary | ICD-10-CM | POA: Diagnosis not present

## 2023-03-16 DIAGNOSIS — Z0000 Encounter for general adult medical examination without abnormal findings: Secondary | ICD-10-CM | POA: Diagnosis not present

## 2023-06-14 DIAGNOSIS — H16203 Unspecified keratoconjunctivitis, bilateral: Secondary | ICD-10-CM | POA: Diagnosis not present

## 2023-06-29 DIAGNOSIS — H16203 Unspecified keratoconjunctivitis, bilateral: Secondary | ICD-10-CM | POA: Diagnosis not present

## 2023-11-01 DIAGNOSIS — S91119A Laceration without foreign body of unspecified toe without damage to nail, initial encounter: Secondary | ICD-10-CM | POA: Diagnosis not present

## 2023-11-01 DIAGNOSIS — Z23 Encounter for immunization: Secondary | ICD-10-CM | POA: Diagnosis not present

## 2023-12-23 DIAGNOSIS — H10503 Unspecified blepharoconjunctivitis, bilateral: Secondary | ICD-10-CM | POA: Diagnosis not present

## 2024-03-06 DIAGNOSIS — H1013 Acute atopic conjunctivitis, bilateral: Secondary | ICD-10-CM | POA: Diagnosis not present

## 2024-03-06 DIAGNOSIS — Z8709 Personal history of other diseases of the respiratory system: Secondary | ICD-10-CM | POA: Diagnosis not present

## 2024-04-10 DIAGNOSIS — R3912 Poor urinary stream: Secondary | ICD-10-CM | POA: Diagnosis not present

## 2024-04-10 DIAGNOSIS — N401 Enlarged prostate with lower urinary tract symptoms: Secondary | ICD-10-CM | POA: Diagnosis not present

## 2024-04-14 DIAGNOSIS — R972 Elevated prostate specific antigen [PSA]: Secondary | ICD-10-CM | POA: Diagnosis not present

## 2024-04-14 DIAGNOSIS — N401 Enlarged prostate with lower urinary tract symptoms: Secondary | ICD-10-CM | POA: Diagnosis not present

## 2024-04-14 DIAGNOSIS — R3912 Poor urinary stream: Secondary | ICD-10-CM | POA: Diagnosis not present

## 2024-05-22 DIAGNOSIS — B070 Plantar wart: Secondary | ICD-10-CM | POA: Diagnosis not present

## 2024-08-16 DIAGNOSIS — E559 Vitamin D deficiency, unspecified: Secondary | ICD-10-CM | POA: Diagnosis not present

## 2024-08-23 ENCOUNTER — Other Ambulatory Visit: Payer: Self-pay | Admitting: Physician Assistant

## 2024-08-23 DIAGNOSIS — Z9189 Other specified personal risk factors, not elsewhere classified: Secondary | ICD-10-CM

## 2024-08-23 DIAGNOSIS — Z0000 Encounter for general adult medical examination without abnormal findings: Secondary | ICD-10-CM | POA: Diagnosis not present

## 2024-08-23 DIAGNOSIS — I10 Essential (primary) hypertension: Secondary | ICD-10-CM | POA: Diagnosis not present

## 2024-08-23 DIAGNOSIS — E782 Mixed hyperlipidemia: Secondary | ICD-10-CM | POA: Diagnosis not present

## 2024-08-23 DIAGNOSIS — Z1331 Encounter for screening for depression: Secondary | ICD-10-CM | POA: Diagnosis not present

## 2024-09-01 ENCOUNTER — Ambulatory Visit
Admission: RE | Admit: 2024-09-01 | Discharge: 2024-09-01 | Disposition: A | Discharge status: Home / Self Care | Source: Ambulatory Visit | Attending: Physician Assistant | Admitting: Physician Assistant

## 2024-09-01 DIAGNOSIS — Z9189 Other specified personal risk factors, not elsewhere classified: Secondary | ICD-10-CM
